# Patient Record
Sex: Female | Born: 1955 | Race: White | Hispanic: No | Marital: Married | Smoking: Never smoker
Health system: Southern US, Community
[De-identification: ages and names within clinical notes are randomized; demographics above are authoritative.]

## PROBLEM LIST (undated history)

## (undated) DIAGNOSIS — R011 Cardiac murmur, unspecified: Secondary | ICD-10-CM

## (undated) DIAGNOSIS — I10 Essential (primary) hypertension: Secondary | ICD-10-CM

## (undated) DIAGNOSIS — I350 Nonrheumatic aortic (valve) stenosis: Secondary | ICD-10-CM

## (undated) HISTORY — DX: Essential (primary) hypertension: I10

## (undated) HISTORY — DX: Nonrheumatic aortic (valve) stenosis: I35.0

## (undated) HISTORY — DX: Cardiac murmur, unspecified: R01.1

## (undated) HISTORY — PX: HYSTERECTOMY, TOTAL, LAPAROSCOPIC, ROBOT-ASSISTED WITH SALPINGECTOMY: SHX7587

## (undated) HISTORY — PX: OTHER SURGICAL HISTORY: SHX169

## (undated) HISTORY — PX: LAPAROSCOPIC SIGMOID COLECTOMY: SHX5928

---

## 1999-05-13 HISTORY — PX: APPENDECTOMY: SHX54

## 1999-11-22 ENCOUNTER — Encounter: Admission: RE | Admit: 1999-11-22 | Discharge: 2000-02-20 | Payer: Self-pay | Admitting: Radiation Oncology

## 2004-03-15 ENCOUNTER — Ambulatory Visit: Payer: Self-pay | Admitting: Oncology

## 2004-09-19 ENCOUNTER — Ambulatory Visit: Payer: Self-pay | Admitting: Oncology

## 2005-01-17 ENCOUNTER — Ambulatory Visit: Payer: Self-pay | Admitting: Oncology

## 2005-05-14 ENCOUNTER — Ambulatory Visit: Payer: Self-pay | Admitting: Oncology

## 2005-06-23 ENCOUNTER — Encounter: Admission: RE | Admit: 2005-06-23 | Discharge: 2005-06-23 | Payer: Self-pay | Admitting: Nephrology

## 2007-02-12 IMAGING — US US RENAL
1 series · 13 of 25 positions shown · non-contrast
Comparison: None.

CLINICAL DATA: Hypertension/renal insufficiency.  History of medullary sponge kidney.  
 RENAL/URINARY TRACT ULTRASOUND:
TECHNIQUE: Complete ultrasound examination of the urinary tract was performed including evaluation of the kidneys, renal collecting systems, and urinary bladder.

[Series 1: unknown · 0.27mm/px · 13 of 39 slices shown]
[im 1/39]
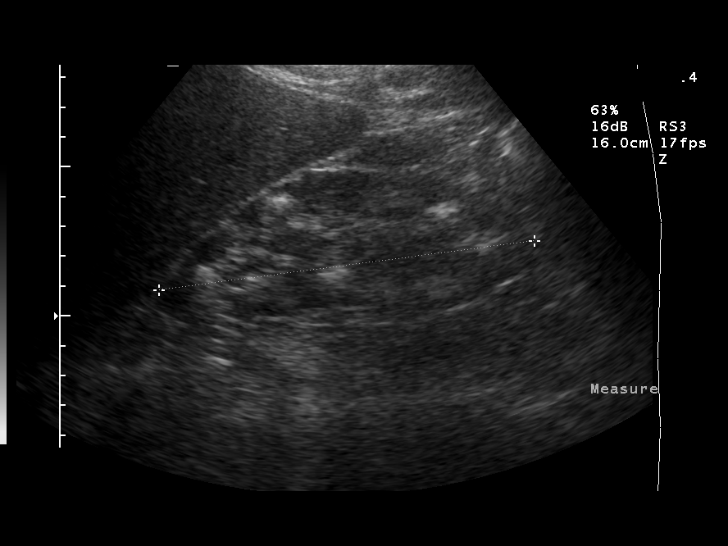
[im 4/39]
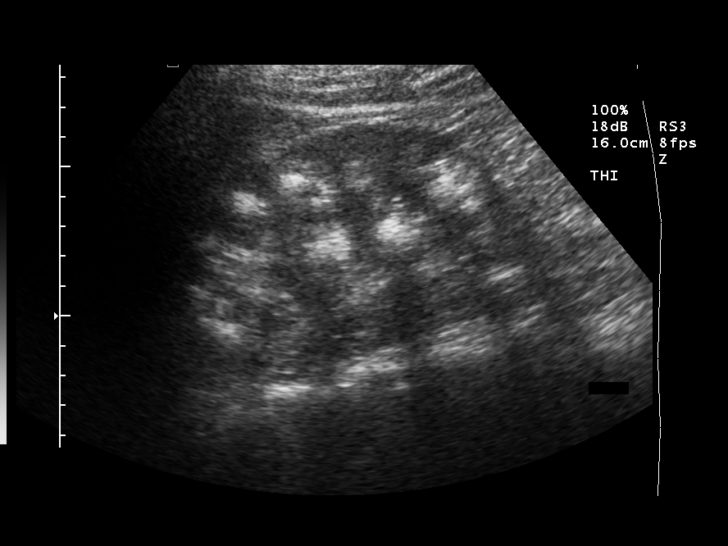
[im 7/39]
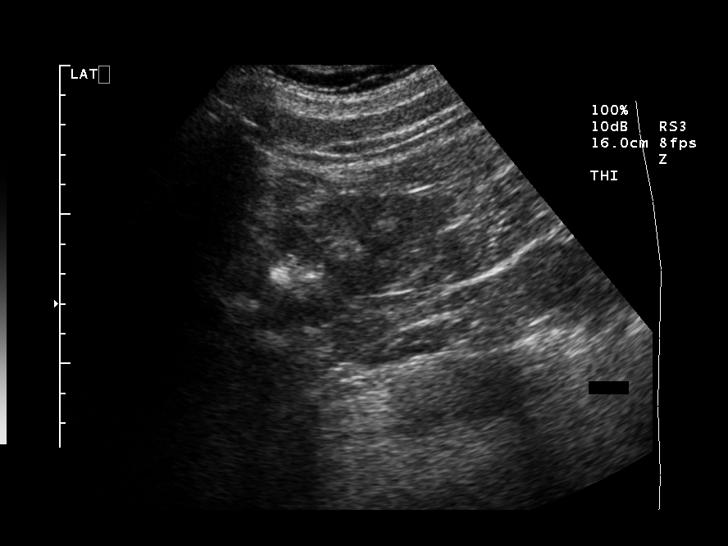
[im 10/39]
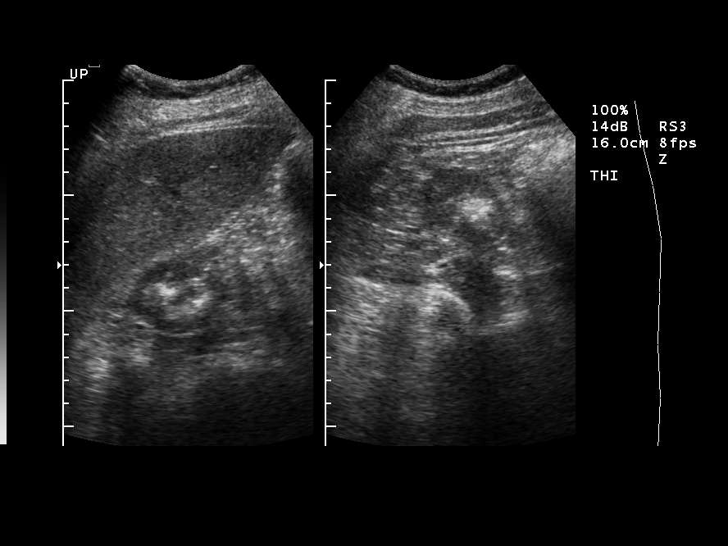
[im 13/39]
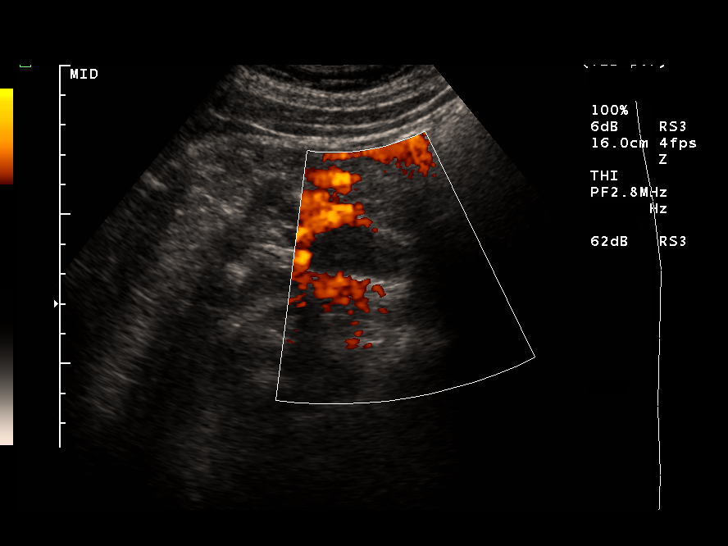
[im 16/39]
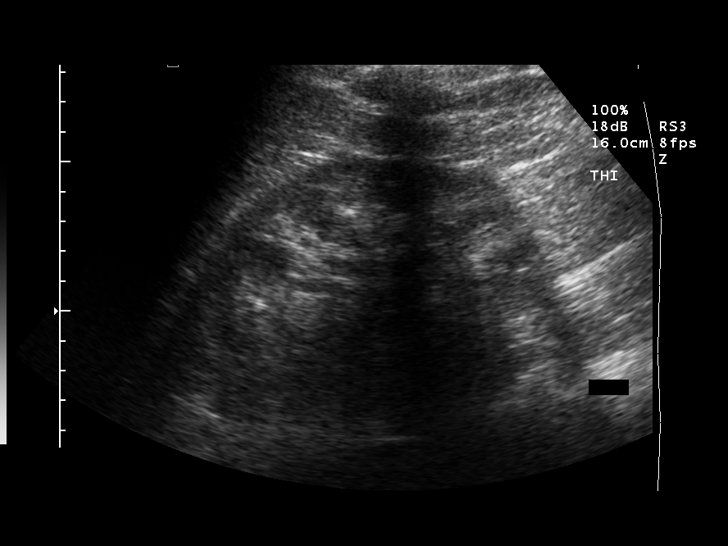
[im 20/39]
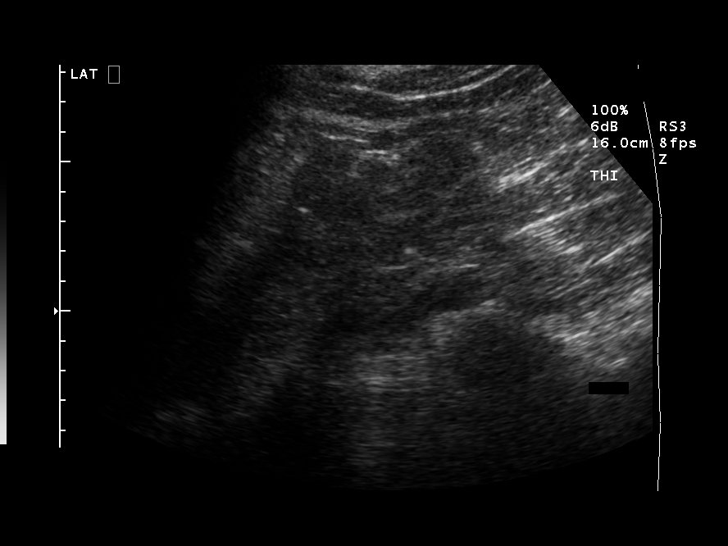
[im 23/39]
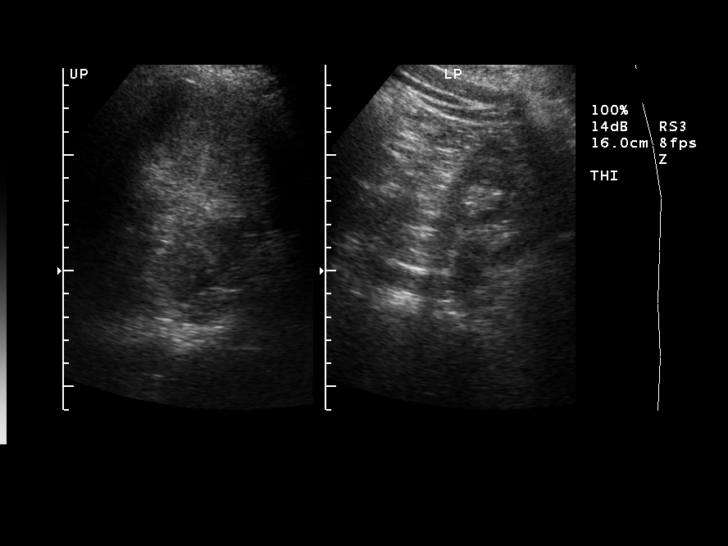
[im 26/39]
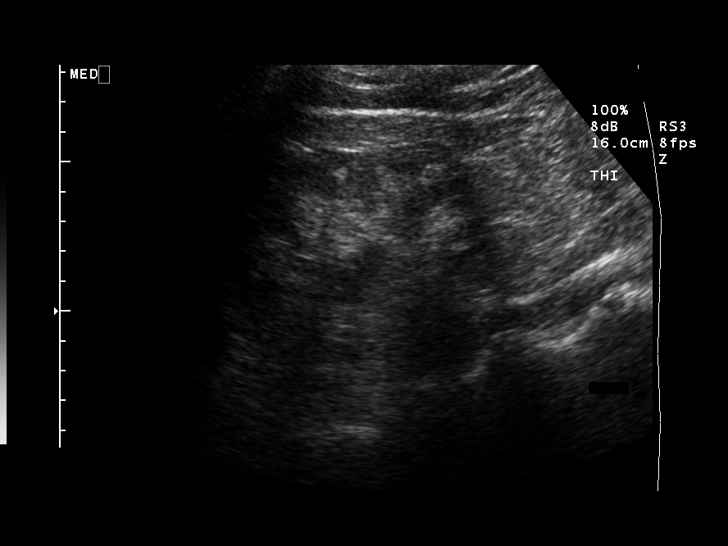
[im 29/39]
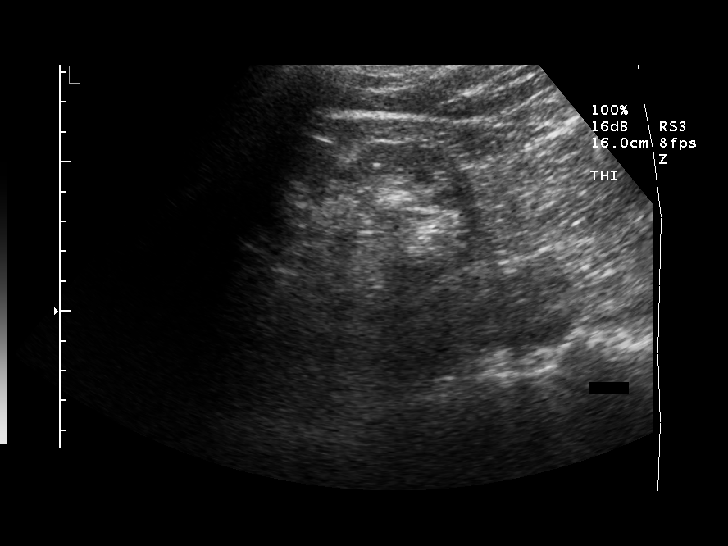
[im 32/39]
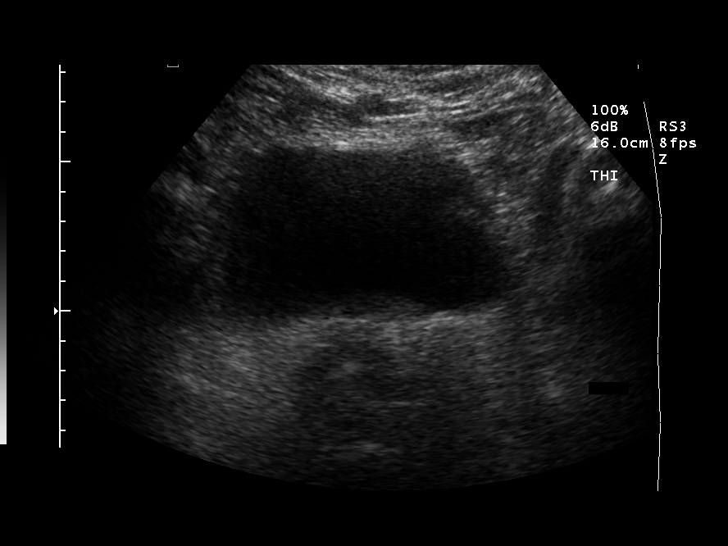
[im 35/39]
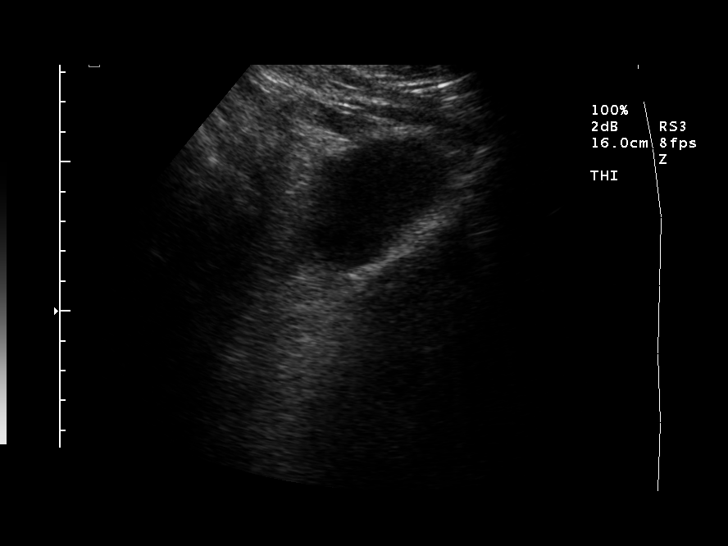
[im 39/39]
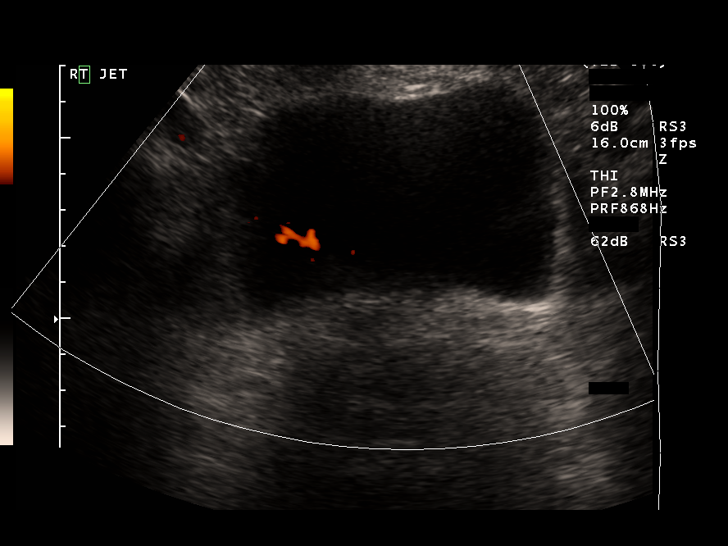

[13 of 25 positions shown; findings below may reference images not displayed]

FINDINGS: The right kidney is 12.7 and the left 13.1 cm in size.  There is striking increased density of the renal medulla/pericaliceal regions bilaterally, more so on the right than the left.  The findings are characteristic of nephrocalcinosis and quite consistent with medullary sponge kidney.  There is some prominence of the renal pelvis and perhaps minimal caliectasis on the right.  No caliectasis or pyelectasis on the left.  Bladder unremarkable.  There are bilateral ureteral jets but the jet on the right is diminished as compared to the jet on the left.  A mild obstructive component cannot be excluded on the right, based on these findings.
IMPRESSION: 1.  Findings consistent with bilateral pericaliceal nephrocalcinosis.  
 2.  There is mild caliectasis and pyelectasis on the right, with a relative decrease in the right ureteral jet.  Question obstructive component.  See report.

## 2013-06-14 ENCOUNTER — Ambulatory Visit (INDEPENDENT_AMBULATORY_CARE_PROVIDER_SITE_OTHER): Payer: BC Managed Care – PPO | Admitting: Neurology

## 2013-06-14 ENCOUNTER — Ambulatory Visit (INDEPENDENT_AMBULATORY_CARE_PROVIDER_SITE_OTHER): Payer: Self-pay | Admitting: Radiology

## 2013-06-14 DIAGNOSIS — M25519 Pain in unspecified shoulder: Secondary | ICD-10-CM

## 2013-06-14 DIAGNOSIS — Z0289 Encounter for other administrative examinations: Secondary | ICD-10-CM

## 2013-06-14 DIAGNOSIS — M79609 Pain in unspecified limb: Secondary | ICD-10-CM

## 2013-06-14 DIAGNOSIS — M25511 Pain in right shoulder: Secondary | ICD-10-CM

## 2013-06-14 NOTE — Procedures (Signed)
   NCS (NERVE CONDUCTION STUDY) WITH EMG (ELECTROMYOGRAPHY) REPORT   STUDY DATE: February third 2015 PATIENT NAME: Mackenzie Pearson DOB: 01-16-1956 MRN: [id number]    TECHNOLOGIST: Kaylyn LimSue Fox ELECTROMYOGRAPHER: Levert FeinsteinYan, Eriberto Felch M.D.  CLINICAL INFORMATION:  58 years old right-handed Caucasian female, with acute onset of right anterior shoulder pain in May 06 2013, radiating pain right lateral arm, ulnar forearm, subjective weakness, she denies significant neck pain,  On examination:there is no atrophy, she has slight right shoulder abduction, external rotation, elbow flexion, pronation weakness, Rest of the muscle group testing was normal, mildly decreased right brachioradialis reflex, well-preserved bilateral biceps, triceps reflex   FINDINGS: NERVE CONDUCTION STUDY: Right median sensory response has mildly increased the peak latency, normal snap amplitude. Right ulnar, radial sensory responses were normal. Right ulnar, radial nerve responses were normal. Right median motor response showed mildly prolonged distal latency, with preserved C. map amplitude, conduction velocity, F wave latency.   Bilateral median, lateral anticutaneous sensory responses were normal   NEEDLE ELECTROMYOGRAPHY: Selected needle examination was performed at right upper extremity muscles, and right cervical paraspinal muscles   Needle examination of right first dorsal interossei, flexor carpi ulnaris, Extensor digital communis, pronator teres, biceps, triceps, deltoid, brachioradialis was essentially normal,  There was no spontaneous activity at right cervical paraspinal muscles, right C5, 6, 7  IMPRESSION:   This is mild abnormal study, there is mild to moderate right carpal tunnel syndrome, there is no evidence of right brachial plexopathy, or right cervical radiculopathy.   INTERPRETING PHYSICIAN:   Levert FeinsteinYan, Kiasha Bellin M.D. Ph.D. [facility] [address], [address] [place], [place] [address] [phone]  [phone]

## 2019-08-11 DIAGNOSIS — N135 Crossing vessel and stricture of ureter without hydronephrosis: Secondary | ICD-10-CM | POA: Diagnosis not present

## 2019-08-11 DIAGNOSIS — N200 Calculus of kidney: Secondary | ICD-10-CM | POA: Diagnosis not present

## 2019-10-19 DIAGNOSIS — I700 Atherosclerosis of aorta: Secondary | ICD-10-CM | POA: Diagnosis not present

## 2019-10-19 DIAGNOSIS — N132 Hydronephrosis with renal and ureteral calculous obstruction: Secondary | ICD-10-CM | POA: Diagnosis not present

## 2019-10-19 DIAGNOSIS — N200 Calculus of kidney: Secondary | ICD-10-CM | POA: Diagnosis not present

## 2019-10-19 DIAGNOSIS — Z79899 Other long term (current) drug therapy: Secondary | ICD-10-CM | POA: Diagnosis not present

## 2019-10-19 DIAGNOSIS — R109 Unspecified abdominal pain: Secondary | ICD-10-CM | POA: Diagnosis not present

## 2019-10-19 DIAGNOSIS — K449 Diaphragmatic hernia without obstruction or gangrene: Secondary | ICD-10-CM | POA: Diagnosis not present

## 2019-10-19 DIAGNOSIS — I10 Essential (primary) hypertension: Secondary | ICD-10-CM | POA: Diagnosis not present

## 2019-10-20 DIAGNOSIS — N289 Disorder of kidney and ureter, unspecified: Secondary | ICD-10-CM | POA: Diagnosis not present

## 2019-10-20 DIAGNOSIS — N1330 Unspecified hydronephrosis: Secondary | ICD-10-CM | POA: Diagnosis not present

## 2019-10-20 DIAGNOSIS — N200 Calculus of kidney: Secondary | ICD-10-CM | POA: Diagnosis not present

## 2019-10-21 DIAGNOSIS — Z79891 Long term (current) use of opiate analgesic: Secondary | ICD-10-CM | POA: Diagnosis not present

## 2019-10-21 DIAGNOSIS — F988 Other specified behavioral and emotional disorders with onset usually occurring in childhood and adolescence: Secondary | ICD-10-CM | POA: Diagnosis not present

## 2019-10-21 DIAGNOSIS — N201 Calculus of ureter: Secondary | ICD-10-CM | POA: Diagnosis not present

## 2019-10-21 DIAGNOSIS — F418 Other specified anxiety disorders: Secondary | ICD-10-CM | POA: Diagnosis not present

## 2019-10-21 DIAGNOSIS — J302 Other seasonal allergic rhinitis: Secondary | ICD-10-CM | POA: Diagnosis not present

## 2019-10-21 DIAGNOSIS — Z79899 Other long term (current) drug therapy: Secondary | ICD-10-CM | POA: Diagnosis not present

## 2019-10-21 DIAGNOSIS — Z85038 Personal history of other malignant neoplasm of large intestine: Secondary | ICD-10-CM | POA: Diagnosis not present

## 2019-10-21 DIAGNOSIS — K219 Gastro-esophageal reflux disease without esophagitis: Secondary | ICD-10-CM | POA: Diagnosis not present

## 2019-10-21 DIAGNOSIS — G8929 Other chronic pain: Secondary | ICD-10-CM | POA: Diagnosis not present

## 2019-10-21 DIAGNOSIS — N132 Hydronephrosis with renal and ureteral calculous obstruction: Secondary | ICD-10-CM | POA: Diagnosis not present

## 2019-10-21 DIAGNOSIS — N289 Disorder of kidney and ureter, unspecified: Secondary | ICD-10-CM | POA: Diagnosis not present

## 2019-10-21 DIAGNOSIS — N1330 Unspecified hydronephrosis: Secondary | ICD-10-CM | POA: Diagnosis not present

## 2019-10-21 DIAGNOSIS — E669 Obesity, unspecified: Secondary | ICD-10-CM | POA: Diagnosis not present

## 2019-10-21 DIAGNOSIS — I10 Essential (primary) hypertension: Secondary | ICD-10-CM | POA: Diagnosis not present

## 2019-12-21 DIAGNOSIS — N390 Urinary tract infection, site not specified: Secondary | ICD-10-CM | POA: Diagnosis not present

## 2019-12-21 DIAGNOSIS — N200 Calculus of kidney: Secondary | ICD-10-CM | POA: Diagnosis not present

## 2019-12-21 DIAGNOSIS — N289 Disorder of kidney and ureter, unspecified: Secondary | ICD-10-CM | POA: Diagnosis not present

## 2019-12-21 DIAGNOSIS — N1330 Unspecified hydronephrosis: Secondary | ICD-10-CM | POA: Diagnosis not present

## 2019-12-21 DIAGNOSIS — Z87442 Personal history of urinary calculi: Secondary | ICD-10-CM | POA: Diagnosis not present

## 2020-01-23 DIAGNOSIS — Z1159 Encounter for screening for other viral diseases: Secondary | ICD-10-CM | POA: Diagnosis not present

## 2020-01-26 DIAGNOSIS — Z466 Encounter for fitting and adjustment of urinary device: Secondary | ICD-10-CM | POA: Diagnosis not present

## 2020-01-26 DIAGNOSIS — N131 Hydronephrosis with ureteral stricture, not elsewhere classified: Secondary | ICD-10-CM | POA: Diagnosis not present

## 2020-01-26 DIAGNOSIS — N130 Hydronephrosis with ureteropelvic junction obstruction: Secondary | ICD-10-CM | POA: Diagnosis not present

## 2020-01-26 DIAGNOSIS — K219 Gastro-esophageal reflux disease without esophagitis: Secondary | ICD-10-CM | POA: Diagnosis not present

## 2020-01-26 DIAGNOSIS — N132 Hydronephrosis with renal and ureteral calculous obstruction: Secondary | ICD-10-CM | POA: Diagnosis not present

## 2020-01-26 DIAGNOSIS — I10 Essential (primary) hypertension: Secondary | ICD-10-CM | POA: Diagnosis not present

## 2020-01-26 DIAGNOSIS — N1330 Unspecified hydronephrosis: Secondary | ICD-10-CM | POA: Diagnosis not present

## 2020-02-21 DIAGNOSIS — N289 Disorder of kidney and ureter, unspecified: Secondary | ICD-10-CM | POA: Diagnosis not present

## 2020-02-21 DIAGNOSIS — N1330 Unspecified hydronephrosis: Secondary | ICD-10-CM | POA: Diagnosis not present

## 2020-02-21 DIAGNOSIS — N200 Calculus of kidney: Secondary | ICD-10-CM | POA: Diagnosis not present

## 2020-04-04 DIAGNOSIS — N1330 Unspecified hydronephrosis: Secondary | ICD-10-CM | POA: Diagnosis not present

## 2020-04-04 DIAGNOSIS — N289 Disorder of kidney and ureter, unspecified: Secondary | ICD-10-CM | POA: Diagnosis not present

## 2020-04-26 DIAGNOSIS — Z79891 Long term (current) use of opiate analgesic: Secondary | ICD-10-CM | POA: Diagnosis not present

## 2020-04-26 DIAGNOSIS — K219 Gastro-esophageal reflux disease without esophagitis: Secondary | ICD-10-CM | POA: Diagnosis not present

## 2020-04-26 DIAGNOSIS — M1712 Unilateral primary osteoarthritis, left knee: Secondary | ICD-10-CM | POA: Diagnosis not present

## 2020-04-26 DIAGNOSIS — M5412 Radiculopathy, cervical region: Secondary | ICD-10-CM | POA: Diagnosis not present

## 2020-04-26 DIAGNOSIS — I10 Essential (primary) hypertension: Secondary | ICD-10-CM | POA: Diagnosis not present

## 2020-04-26 DIAGNOSIS — N130 Hydronephrosis with ureteropelvic junction obstruction: Secondary | ICD-10-CM | POA: Diagnosis not present

## 2020-04-26 DIAGNOSIS — G8929 Other chronic pain: Secondary | ICD-10-CM | POA: Diagnosis not present

## 2020-04-26 DIAGNOSIS — Z466 Encounter for fitting and adjustment of urinary device: Secondary | ICD-10-CM | POA: Diagnosis not present

## 2020-04-26 DIAGNOSIS — Z79899 Other long term (current) drug therapy: Secondary | ICD-10-CM | POA: Diagnosis not present

## 2020-04-26 DIAGNOSIS — N132 Hydronephrosis with renal and ureteral calculous obstruction: Secondary | ICD-10-CM | POA: Diagnosis not present

## 2020-04-26 DIAGNOSIS — E669 Obesity, unspecified: Secondary | ICD-10-CM | POA: Diagnosis not present

## 2020-04-26 DIAGNOSIS — Z87442 Personal history of urinary calculi: Secondary | ICD-10-CM | POA: Diagnosis not present

## 2020-04-26 DIAGNOSIS — N135 Crossing vessel and stricture of ureter without hydronephrosis: Secondary | ICD-10-CM | POA: Diagnosis not present

## 2020-04-26 DIAGNOSIS — N189 Chronic kidney disease, unspecified: Secondary | ICD-10-CM | POA: Diagnosis not present

## 2020-04-26 DIAGNOSIS — I129 Hypertensive chronic kidney disease with stage 1 through stage 4 chronic kidney disease, or unspecified chronic kidney disease: Secondary | ICD-10-CM | POA: Diagnosis not present

## 2020-05-23 DIAGNOSIS — E2839 Other primary ovarian failure: Secondary | ICD-10-CM | POA: Diagnosis not present

## 2020-05-23 DIAGNOSIS — Z1331 Encounter for screening for depression: Secondary | ICD-10-CM | POA: Diagnosis not present

## 2020-05-23 DIAGNOSIS — Z1159 Encounter for screening for other viral diseases: Secondary | ICD-10-CM | POA: Diagnosis not present

## 2020-05-23 DIAGNOSIS — Z1231 Encounter for screening mammogram for malignant neoplasm of breast: Secondary | ICD-10-CM | POA: Diagnosis not present

## 2020-05-23 DIAGNOSIS — Z131 Encounter for screening for diabetes mellitus: Secondary | ICD-10-CM | POA: Diagnosis not present

## 2020-05-23 DIAGNOSIS — R0981 Nasal congestion: Secondary | ICD-10-CM | POA: Diagnosis not present

## 2020-05-23 DIAGNOSIS — Z0001 Encounter for general adult medical examination with abnormal findings: Secondary | ICD-10-CM | POA: Diagnosis not present

## 2020-05-23 DIAGNOSIS — F419 Anxiety disorder, unspecified: Secondary | ICD-10-CM | POA: Diagnosis not present

## 2020-05-23 DIAGNOSIS — F332 Major depressive disorder, recurrent severe without psychotic features: Secondary | ICD-10-CM | POA: Diagnosis not present

## 2020-05-23 DIAGNOSIS — R41840 Attention and concentration deficit: Secondary | ICD-10-CM | POA: Diagnosis not present

## 2020-08-02 DIAGNOSIS — R051 Acute cough: Secondary | ICD-10-CM | POA: Diagnosis not present

## 2020-08-17 DIAGNOSIS — N1330 Unspecified hydronephrosis: Secondary | ICD-10-CM | POA: Diagnosis not present

## 2020-08-17 DIAGNOSIS — N289 Disorder of kidney and ureter, unspecified: Secondary | ICD-10-CM | POA: Diagnosis not present

## 2020-08-17 DIAGNOSIS — N390 Urinary tract infection, site not specified: Secondary | ICD-10-CM | POA: Diagnosis not present

## 2020-08-25 DIAGNOSIS — R509 Fever, unspecified: Secondary | ICD-10-CM | POA: Diagnosis not present

## 2020-08-25 DIAGNOSIS — J189 Pneumonia, unspecified organism: Secondary | ICD-10-CM | POA: Diagnosis not present

## 2020-08-25 DIAGNOSIS — R0981 Nasal congestion: Secondary | ICD-10-CM | POA: Diagnosis not present

## 2020-08-25 DIAGNOSIS — J111 Influenza due to unidentified influenza virus with other respiratory manifestations: Secondary | ICD-10-CM | POA: Diagnosis not present

## 2020-08-25 DIAGNOSIS — Z20828 Contact with and (suspected) exposure to other viral communicable diseases: Secondary | ICD-10-CM | POA: Diagnosis not present

## 2020-08-25 DIAGNOSIS — R0602 Shortness of breath: Secondary | ICD-10-CM | POA: Diagnosis not present

## 2020-08-25 DIAGNOSIS — R051 Acute cough: Secondary | ICD-10-CM | POA: Diagnosis not present

## 2020-08-28 DIAGNOSIS — N200 Calculus of kidney: Secondary | ICD-10-CM | POA: Diagnosis not present

## 2020-08-28 DIAGNOSIS — I878 Other specified disorders of veins: Secondary | ICD-10-CM | POA: Diagnosis not present

## 2020-08-28 DIAGNOSIS — N183 Chronic kidney disease, stage 3 unspecified: Secondary | ICD-10-CM | POA: Diagnosis not present

## 2020-08-28 DIAGNOSIS — N289 Disorder of kidney and ureter, unspecified: Secondary | ICD-10-CM | POA: Diagnosis not present

## 2020-08-28 DIAGNOSIS — Z960 Presence of urogenital implants: Secondary | ICD-10-CM | POA: Diagnosis not present

## 2020-08-28 DIAGNOSIS — N1330 Unspecified hydronephrosis: Secondary | ICD-10-CM | POA: Diagnosis not present

## 2020-08-30 DIAGNOSIS — N131 Hydronephrosis with ureteral stricture, not elsewhere classified: Secondary | ICD-10-CM | POA: Diagnosis not present

## 2020-08-30 DIAGNOSIS — Z87442 Personal history of urinary calculi: Secondary | ICD-10-CM | POA: Diagnosis not present

## 2020-08-30 DIAGNOSIS — Z466 Encounter for fitting and adjustment of urinary device: Secondary | ICD-10-CM | POA: Diagnosis not present

## 2020-08-30 DIAGNOSIS — F419 Anxiety disorder, unspecified: Secondary | ICD-10-CM | POA: Diagnosis not present

## 2020-08-30 DIAGNOSIS — E669 Obesity, unspecified: Secondary | ICD-10-CM | POA: Diagnosis not present

## 2020-08-30 DIAGNOSIS — Z85038 Personal history of other malignant neoplasm of large intestine: Secondary | ICD-10-CM | POA: Diagnosis not present

## 2020-08-30 DIAGNOSIS — N135 Crossing vessel and stricture of ureter without hydronephrosis: Secondary | ICD-10-CM | POA: Diagnosis not present

## 2020-08-30 DIAGNOSIS — F329 Major depressive disorder, single episode, unspecified: Secondary | ICD-10-CM | POA: Diagnosis not present

## 2020-08-30 DIAGNOSIS — I10 Essential (primary) hypertension: Secondary | ICD-10-CM | POA: Diagnosis not present

## 2020-08-30 DIAGNOSIS — Q615 Medullary cystic kidney: Secondary | ICD-10-CM | POA: Diagnosis not present

## 2020-08-30 DIAGNOSIS — N1330 Unspecified hydronephrosis: Secondary | ICD-10-CM | POA: Diagnosis not present

## 2020-10-09 DIAGNOSIS — N390 Urinary tract infection, site not specified: Secondary | ICD-10-CM | POA: Diagnosis not present

## 2020-10-16 DIAGNOSIS — D2239 Melanocytic nevi of other parts of face: Secondary | ICD-10-CM | POA: Diagnosis not present

## 2020-10-16 DIAGNOSIS — L821 Other seborrheic keratosis: Secondary | ICD-10-CM | POA: Diagnosis not present

## 2020-10-16 DIAGNOSIS — L820 Inflamed seborrheic keratosis: Secondary | ICD-10-CM | POA: Diagnosis not present

## 2020-10-16 DIAGNOSIS — D485 Neoplasm of uncertain behavior of skin: Secondary | ICD-10-CM | POA: Diagnosis not present

## 2020-10-16 DIAGNOSIS — L814 Other melanin hyperpigmentation: Secondary | ICD-10-CM | POA: Diagnosis not present

## 2020-11-26 DIAGNOSIS — N390 Urinary tract infection, site not specified: Secondary | ICD-10-CM | POA: Diagnosis not present

## 2020-11-27 DIAGNOSIS — F3341 Major depressive disorder, recurrent, in partial remission: Secondary | ICD-10-CM | POA: Diagnosis not present

## 2020-11-27 DIAGNOSIS — Z79899 Other long term (current) drug therapy: Secondary | ICD-10-CM | POA: Diagnosis not present

## 2020-11-27 DIAGNOSIS — F419 Anxiety disorder, unspecified: Secondary | ICD-10-CM | POA: Diagnosis not present

## 2020-11-27 DIAGNOSIS — R7303 Prediabetes: Secondary | ICD-10-CM | POA: Diagnosis not present

## 2020-11-27 DIAGNOSIS — I10 Essential (primary) hypertension: Secondary | ICD-10-CM | POA: Diagnosis not present

## 2020-11-29 DIAGNOSIS — K219 Gastro-esophageal reflux disease without esophagitis: Secondary | ICD-10-CM | POA: Diagnosis not present

## 2020-11-29 DIAGNOSIS — F419 Anxiety disorder, unspecified: Secondary | ICD-10-CM | POA: Diagnosis not present

## 2020-11-29 DIAGNOSIS — N131 Hydronephrosis with ureteral stricture, not elsewhere classified: Secondary | ICD-10-CM | POA: Diagnosis not present

## 2020-11-29 DIAGNOSIS — N135 Crossing vessel and stricture of ureter without hydronephrosis: Secondary | ICD-10-CM | POA: Diagnosis not present

## 2020-11-29 DIAGNOSIS — I10 Essential (primary) hypertension: Secondary | ICD-10-CM | POA: Diagnosis not present

## 2020-11-29 DIAGNOSIS — F329 Major depressive disorder, single episode, unspecified: Secondary | ICD-10-CM | POA: Diagnosis not present

## 2020-11-29 DIAGNOSIS — Z85038 Personal history of other malignant neoplasm of large intestine: Secondary | ICD-10-CM | POA: Diagnosis not present

## 2020-11-29 DIAGNOSIS — N1330 Unspecified hydronephrosis: Secondary | ICD-10-CM | POA: Diagnosis not present

## 2020-11-29 DIAGNOSIS — Z87442 Personal history of urinary calculi: Secondary | ICD-10-CM | POA: Diagnosis not present

## 2020-11-29 DIAGNOSIS — Z466 Encounter for fitting and adjustment of urinary device: Secondary | ICD-10-CM | POA: Diagnosis not present

## 2021-02-20 DIAGNOSIS — R928 Other abnormal and inconclusive findings on diagnostic imaging of breast: Secondary | ICD-10-CM | POA: Diagnosis not present

## 2021-02-20 DIAGNOSIS — N6311 Unspecified lump in the right breast, upper outer quadrant: Secondary | ICD-10-CM | POA: Diagnosis not present

## 2021-02-20 DIAGNOSIS — N6489 Other specified disorders of breast: Secondary | ICD-10-CM | POA: Diagnosis not present

## 2021-03-26 DIAGNOSIS — Z466 Encounter for fitting and adjustment of urinary device: Secondary | ICD-10-CM | POA: Diagnosis not present

## 2021-03-26 DIAGNOSIS — M160 Bilateral primary osteoarthritis of hip: Secondary | ICD-10-CM | POA: Diagnosis not present

## 2021-03-26 DIAGNOSIS — N1330 Unspecified hydronephrosis: Secondary | ICD-10-CM | POA: Diagnosis not present

## 2021-03-26 DIAGNOSIS — I878 Other specified disorders of veins: Secondary | ICD-10-CM | POA: Diagnosis not present

## 2021-03-26 DIAGNOSIS — Z87442 Personal history of urinary calculi: Secondary | ICD-10-CM | POA: Diagnosis not present

## 2021-03-26 DIAGNOSIS — N390 Urinary tract infection, site not specified: Secondary | ICD-10-CM | POA: Diagnosis not present

## 2021-03-28 DIAGNOSIS — N181 Chronic kidney disease, stage 1: Secondary | ICD-10-CM | POA: Diagnosis not present

## 2021-03-28 DIAGNOSIS — N289 Disorder of kidney and ureter, unspecified: Secondary | ICD-10-CM | POA: Diagnosis not present

## 2021-03-28 DIAGNOSIS — Z466 Encounter for fitting and adjustment of urinary device: Secondary | ICD-10-CM | POA: Diagnosis not present

## 2021-03-28 DIAGNOSIS — N132 Hydronephrosis with renal and ureteral calculous obstruction: Secondary | ICD-10-CM | POA: Diagnosis not present

## 2021-03-28 DIAGNOSIS — N131 Hydronephrosis with ureteral stricture, not elsewhere classified: Secondary | ICD-10-CM | POA: Diagnosis not present

## 2021-03-28 DIAGNOSIS — I10 Essential (primary) hypertension: Secondary | ICD-10-CM | POA: Diagnosis not present

## 2021-03-28 DIAGNOSIS — Z85038 Personal history of other malignant neoplasm of large intestine: Secondary | ICD-10-CM | POA: Diagnosis not present

## 2021-05-14 DIAGNOSIS — N3090 Cystitis, unspecified without hematuria: Secondary | ICD-10-CM | POA: Diagnosis not present

## 2021-05-14 DIAGNOSIS — M545 Low back pain, unspecified: Secondary | ICD-10-CM | POA: Diagnosis not present

## 2021-05-17 DIAGNOSIS — R109 Unspecified abdominal pain: Secondary | ICD-10-CM | POA: Diagnosis not present

## 2021-05-17 DIAGNOSIS — N1330 Unspecified hydronephrosis: Secondary | ICD-10-CM | POA: Diagnosis not present

## 2021-05-17 DIAGNOSIS — N390 Urinary tract infection, site not specified: Secondary | ICD-10-CM | POA: Diagnosis not present

## 2021-06-03 DIAGNOSIS — I10 Essential (primary) hypertension: Secondary | ICD-10-CM | POA: Diagnosis not present

## 2021-06-03 DIAGNOSIS — Z79899 Other long term (current) drug therapy: Secondary | ICD-10-CM | POA: Diagnosis not present

## 2021-06-03 DIAGNOSIS — F419 Anxiety disorder, unspecified: Secondary | ICD-10-CM | POA: Diagnosis not present

## 2021-06-03 DIAGNOSIS — R7303 Prediabetes: Secondary | ICD-10-CM | POA: Diagnosis not present

## 2021-06-03 DIAGNOSIS — J029 Acute pharyngitis, unspecified: Secondary | ICD-10-CM | POA: Diagnosis not present

## 2021-06-13 DIAGNOSIS — Z85038 Personal history of other malignant neoplasm of large intestine: Secondary | ICD-10-CM | POA: Diagnosis not present

## 2021-06-13 DIAGNOSIS — N289 Disorder of kidney and ureter, unspecified: Secondary | ICD-10-CM | POA: Diagnosis not present

## 2021-06-13 DIAGNOSIS — N131 Hydronephrosis with ureteral stricture, not elsewhere classified: Secondary | ICD-10-CM | POA: Diagnosis not present

## 2021-06-13 DIAGNOSIS — Z9049 Acquired absence of other specified parts of digestive tract: Secondary | ICD-10-CM | POA: Diagnosis not present

## 2021-06-13 DIAGNOSIS — Z466 Encounter for fitting and adjustment of urinary device: Secondary | ICD-10-CM | POA: Diagnosis not present

## 2021-06-13 DIAGNOSIS — Z87442 Personal history of urinary calculi: Secondary | ICD-10-CM | POA: Diagnosis not present

## 2021-06-13 DIAGNOSIS — N132 Hydronephrosis with renal and ureteral calculous obstruction: Secondary | ICD-10-CM | POA: Diagnosis not present

## 2021-07-01 DIAGNOSIS — M9901 Segmental and somatic dysfunction of cervical region: Secondary | ICD-10-CM | POA: Diagnosis not present

## 2021-07-01 DIAGNOSIS — M5413 Radiculopathy, cervicothoracic region: Secondary | ICD-10-CM | POA: Diagnosis not present

## 2021-07-01 DIAGNOSIS — G589 Mononeuropathy, unspecified: Secondary | ICD-10-CM | POA: Diagnosis not present

## 2021-07-01 DIAGNOSIS — M47812 Spondylosis without myelopathy or radiculopathy, cervical region: Secondary | ICD-10-CM | POA: Diagnosis not present

## 2021-08-29 DIAGNOSIS — G4700 Insomnia, unspecified: Secondary | ICD-10-CM | POA: Diagnosis not present

## 2021-08-29 DIAGNOSIS — R0989 Other specified symptoms and signs involving the circulatory and respiratory systems: Secondary | ICD-10-CM | POA: Diagnosis not present

## 2021-08-29 DIAGNOSIS — R202 Paresthesia of skin: Secondary | ICD-10-CM | POA: Diagnosis not present

## 2021-08-29 DIAGNOSIS — M4802 Spinal stenosis, cervical region: Secondary | ICD-10-CM | POA: Diagnosis not present

## 2021-09-26 DIAGNOSIS — M5412 Radiculopathy, cervical region: Secondary | ICD-10-CM | POA: Diagnosis not present

## 2021-10-01 DIAGNOSIS — M5412 Radiculopathy, cervical region: Secondary | ICD-10-CM | POA: Diagnosis not present

## 2021-10-01 DIAGNOSIS — R202 Paresthesia of skin: Secondary | ICD-10-CM | POA: Diagnosis not present

## 2021-10-03 DIAGNOSIS — I10 Essential (primary) hypertension: Secondary | ICD-10-CM | POA: Diagnosis not present

## 2021-10-03 DIAGNOSIS — R12 Heartburn: Secondary | ICD-10-CM | POA: Diagnosis not present

## 2021-10-03 DIAGNOSIS — R0989 Other specified symptoms and signs involving the circulatory and respiratory systems: Secondary | ICD-10-CM | POA: Diagnosis not present

## 2021-10-03 DIAGNOSIS — R059 Cough, unspecified: Secondary | ICD-10-CM | POA: Diagnosis not present

## 2021-10-04 DIAGNOSIS — M5412 Radiculopathy, cervical region: Secondary | ICD-10-CM | POA: Diagnosis not present

## 2021-10-09 DIAGNOSIS — N1330 Unspecified hydronephrosis: Secondary | ICD-10-CM | POA: Diagnosis not present

## 2021-10-09 DIAGNOSIS — N390 Urinary tract infection, site not specified: Secondary | ICD-10-CM | POA: Diagnosis not present

## 2021-10-11 DIAGNOSIS — M5412 Radiculopathy, cervical region: Secondary | ICD-10-CM | POA: Diagnosis not present

## 2021-10-11 DIAGNOSIS — M50121 Cervical disc disorder at C4-C5 level with radiculopathy: Secondary | ICD-10-CM | POA: Diagnosis not present

## 2021-10-11 DIAGNOSIS — M50122 Cervical disc disorder at C5-C6 level with radiculopathy: Secondary | ICD-10-CM | POA: Diagnosis not present

## 2021-10-15 DIAGNOSIS — Z87442 Personal history of urinary calculi: Secondary | ICD-10-CM | POA: Diagnosis not present

## 2021-10-15 DIAGNOSIS — N131 Hydronephrosis with ureteral stricture, not elsewhere classified: Secondary | ICD-10-CM | POA: Diagnosis not present

## 2021-10-15 DIAGNOSIS — Z9221 Personal history of antineoplastic chemotherapy: Secondary | ICD-10-CM | POA: Diagnosis not present

## 2021-10-15 DIAGNOSIS — Z85038 Personal history of other malignant neoplasm of large intestine: Secondary | ICD-10-CM | POA: Diagnosis not present

## 2021-10-15 DIAGNOSIS — N135 Crossing vessel and stricture of ureter without hydronephrosis: Secondary | ICD-10-CM | POA: Diagnosis not present

## 2021-10-15 DIAGNOSIS — Z466 Encounter for fitting and adjustment of urinary device: Secondary | ICD-10-CM | POA: Diagnosis not present

## 2021-10-15 DIAGNOSIS — Z923 Personal history of irradiation: Secondary | ICD-10-CM | POA: Diagnosis not present

## 2021-12-11 DIAGNOSIS — N390 Urinary tract infection, site not specified: Secondary | ICD-10-CM | POA: Diagnosis not present

## 2021-12-23 DIAGNOSIS — J209 Acute bronchitis, unspecified: Secondary | ICD-10-CM | POA: Diagnosis not present

## 2021-12-23 DIAGNOSIS — R051 Acute cough: Secondary | ICD-10-CM | POA: Diagnosis not present

## 2022-01-07 DIAGNOSIS — N1330 Unspecified hydronephrosis: Secondary | ICD-10-CM | POA: Diagnosis not present

## 2022-01-07 DIAGNOSIS — N390 Urinary tract infection, site not specified: Secondary | ICD-10-CM | POA: Diagnosis not present

## 2022-01-16 DIAGNOSIS — Z85038 Personal history of other malignant neoplasm of large intestine: Secondary | ICD-10-CM | POA: Diagnosis not present

## 2022-01-16 DIAGNOSIS — N132 Hydronephrosis with renal and ureteral calculous obstruction: Secondary | ICD-10-CM | POA: Diagnosis not present

## 2022-01-16 DIAGNOSIS — N1330 Unspecified hydronephrosis: Secondary | ICD-10-CM | POA: Diagnosis not present

## 2022-01-16 DIAGNOSIS — E669 Obesity, unspecified: Secondary | ICD-10-CM | POA: Diagnosis not present

## 2022-01-16 DIAGNOSIS — Z87442 Personal history of urinary calculi: Secondary | ICD-10-CM | POA: Diagnosis not present

## 2022-01-16 DIAGNOSIS — Z79899 Other long term (current) drug therapy: Secondary | ICD-10-CM | POA: Diagnosis not present

## 2022-01-16 DIAGNOSIS — Q615 Medullary cystic kidney: Secondary | ICD-10-CM | POA: Diagnosis not present

## 2022-01-16 DIAGNOSIS — N135 Crossing vessel and stricture of ureter without hydronephrosis: Secondary | ICD-10-CM | POA: Diagnosis not present

## 2022-01-16 DIAGNOSIS — N131 Hydronephrosis with ureteral stricture, not elsewhere classified: Secondary | ICD-10-CM | POA: Diagnosis not present

## 2022-01-30 DIAGNOSIS — M5412 Radiculopathy, cervical region: Secondary | ICD-10-CM | POA: Diagnosis not present

## 2022-02-05 DIAGNOSIS — Z01818 Encounter for other preprocedural examination: Secondary | ICD-10-CM | POA: Diagnosis not present

## 2022-02-09 HISTORY — PX: CERVICAL SPINE SURGERY: SHX589

## 2022-02-12 DIAGNOSIS — L578 Other skin changes due to chronic exposure to nonionizing radiation: Secondary | ICD-10-CM | POA: Diagnosis not present

## 2022-02-12 DIAGNOSIS — R233 Spontaneous ecchymoses: Secondary | ICD-10-CM | POA: Diagnosis not present

## 2022-02-12 DIAGNOSIS — L820 Inflamed seborrheic keratosis: Secondary | ICD-10-CM | POA: Diagnosis not present

## 2022-02-12 DIAGNOSIS — L814 Other melanin hyperpigmentation: Secondary | ICD-10-CM | POA: Diagnosis not present

## 2022-02-12 DIAGNOSIS — D225 Melanocytic nevi of trunk: Secondary | ICD-10-CM | POA: Diagnosis not present

## 2022-02-24 DIAGNOSIS — R2689 Other abnormalities of gait and mobility: Secondary | ICD-10-CM | POA: Diagnosis not present

## 2022-02-24 DIAGNOSIS — Q615 Medullary cystic kidney: Secondary | ICD-10-CM | POA: Diagnosis not present

## 2022-02-24 DIAGNOSIS — M50121 Cervical disc disorder at C4-C5 level with radiculopathy: Secondary | ICD-10-CM | POA: Diagnosis not present

## 2022-02-24 DIAGNOSIS — M4802 Spinal stenosis, cervical region: Secondary | ICD-10-CM | POA: Diagnosis not present

## 2022-02-24 DIAGNOSIS — M5412 Radiculopathy, cervical region: Secondary | ICD-10-CM | POA: Diagnosis not present

## 2022-02-24 DIAGNOSIS — M50123 Cervical disc disorder at C6-C7 level with radiculopathy: Secondary | ICD-10-CM | POA: Diagnosis not present

## 2022-02-24 DIAGNOSIS — E669 Obesity, unspecified: Secondary | ICD-10-CM | POA: Diagnosis not present

## 2022-02-24 DIAGNOSIS — Z6829 Body mass index (BMI) 29.0-29.9, adult: Secondary | ICD-10-CM | POA: Diagnosis not present

## 2022-02-24 DIAGNOSIS — M50122 Cervical disc disorder at C5-C6 level with radiculopathy: Secondary | ICD-10-CM | POA: Diagnosis not present

## 2022-02-24 DIAGNOSIS — Z23 Encounter for immunization: Secondary | ICD-10-CM | POA: Diagnosis not present

## 2022-02-25 DIAGNOSIS — Q615 Medullary cystic kidney: Secondary | ICD-10-CM | POA: Diagnosis not present

## 2022-02-25 DIAGNOSIS — E669 Obesity, unspecified: Secondary | ICD-10-CM | POA: Diagnosis not present

## 2022-02-25 DIAGNOSIS — M4802 Spinal stenosis, cervical region: Secondary | ICD-10-CM | POA: Diagnosis not present

## 2022-02-25 DIAGNOSIS — Z6829 Body mass index (BMI) 29.0-29.9, adult: Secondary | ICD-10-CM | POA: Diagnosis not present

## 2022-02-25 DIAGNOSIS — M50121 Cervical disc disorder at C4-C5 level with radiculopathy: Secondary | ICD-10-CM | POA: Diagnosis not present

## 2022-02-25 DIAGNOSIS — R2689 Other abnormalities of gait and mobility: Secondary | ICD-10-CM | POA: Diagnosis not present

## 2022-02-25 DIAGNOSIS — Z23 Encounter for immunization: Secondary | ICD-10-CM | POA: Diagnosis not present

## 2022-04-08 DIAGNOSIS — N1330 Unspecified hydronephrosis: Secondary | ICD-10-CM | POA: Diagnosis not present

## 2022-04-08 DIAGNOSIS — N390 Urinary tract infection, site not specified: Secondary | ICD-10-CM | POA: Diagnosis not present

## 2022-04-10 DIAGNOSIS — Z960 Presence of urogenital implants: Secondary | ICD-10-CM | POA: Diagnosis not present

## 2022-04-10 DIAGNOSIS — N200 Calculus of kidney: Secondary | ICD-10-CM | POA: Diagnosis not present

## 2022-04-10 DIAGNOSIS — N1330 Unspecified hydronephrosis: Secondary | ICD-10-CM | POA: Diagnosis not present

## 2022-04-10 DIAGNOSIS — Z466 Encounter for fitting and adjustment of urinary device: Secondary | ICD-10-CM | POA: Diagnosis not present

## 2022-04-17 DIAGNOSIS — Z466 Encounter for fitting and adjustment of urinary device: Secondary | ICD-10-CM | POA: Diagnosis not present

## 2022-04-17 DIAGNOSIS — I10 Essential (primary) hypertension: Secondary | ICD-10-CM | POA: Diagnosis not present

## 2022-04-17 DIAGNOSIS — N131 Hydronephrosis with ureteral stricture, not elsewhere classified: Secondary | ICD-10-CM | POA: Diagnosis not present

## 2022-04-17 DIAGNOSIS — Z87442 Personal history of urinary calculi: Secondary | ICD-10-CM | POA: Diagnosis not present

## 2022-04-21 DIAGNOSIS — R251 Tremor, unspecified: Secondary | ICD-10-CM | POA: Diagnosis not present

## 2022-04-21 DIAGNOSIS — I10 Essential (primary) hypertension: Secondary | ICD-10-CM | POA: Diagnosis not present

## 2022-04-21 DIAGNOSIS — G4700 Insomnia, unspecified: Secondary | ICD-10-CM | POA: Diagnosis not present

## 2022-04-21 DIAGNOSIS — F332 Major depressive disorder, recurrent severe without psychotic features: Secondary | ICD-10-CM | POA: Diagnosis not present

## 2022-04-22 DIAGNOSIS — M5412 Radiculopathy, cervical region: Secondary | ICD-10-CM | POA: Diagnosis not present

## 2022-05-28 DIAGNOSIS — N390 Urinary tract infection, site not specified: Secondary | ICD-10-CM | POA: Diagnosis not present

## 2022-06-11 DIAGNOSIS — K219 Gastro-esophageal reflux disease without esophagitis: Secondary | ICD-10-CM | POA: Diagnosis not present

## 2022-06-11 DIAGNOSIS — R251 Tremor, unspecified: Secondary | ICD-10-CM | POA: Diagnosis not present

## 2022-06-11 DIAGNOSIS — F332 Major depressive disorder, recurrent severe without psychotic features: Secondary | ICD-10-CM | POA: Diagnosis not present

## 2022-06-11 DIAGNOSIS — I10 Essential (primary) hypertension: Secondary | ICD-10-CM | POA: Diagnosis not present

## 2022-06-17 DIAGNOSIS — N390 Urinary tract infection, site not specified: Secondary | ICD-10-CM | POA: Diagnosis not present

## 2022-07-01 DIAGNOSIS — N390 Urinary tract infection, site not specified: Secondary | ICD-10-CM | POA: Diagnosis not present

## 2022-07-01 DIAGNOSIS — N1330 Unspecified hydronephrosis: Secondary | ICD-10-CM | POA: Diagnosis not present

## 2022-07-17 DIAGNOSIS — I10 Essential (primary) hypertension: Secondary | ICD-10-CM | POA: Diagnosis not present

## 2022-07-17 DIAGNOSIS — N131 Hydronephrosis with ureteral stricture, not elsewhere classified: Secondary | ICD-10-CM | POA: Diagnosis not present

## 2022-07-17 DIAGNOSIS — Q615 Medullary cystic kidney: Secondary | ICD-10-CM | POA: Diagnosis not present

## 2022-07-17 DIAGNOSIS — Z79899 Other long term (current) drug therapy: Secondary | ICD-10-CM | POA: Diagnosis not present

## 2022-07-17 DIAGNOSIS — Z85038 Personal history of other malignant neoplasm of large intestine: Secondary | ICD-10-CM | POA: Diagnosis not present

## 2022-07-17 DIAGNOSIS — N2889 Other specified disorders of kidney and ureter: Secondary | ICD-10-CM | POA: Diagnosis not present

## 2022-09-02 DIAGNOSIS — J040 Acute laryngitis: Secondary | ICD-10-CM | POA: Diagnosis not present

## 2022-09-02 DIAGNOSIS — R051 Acute cough: Secondary | ICD-10-CM | POA: Diagnosis not present

## 2022-09-02 DIAGNOSIS — R030 Elevated blood-pressure reading, without diagnosis of hypertension: Secondary | ICD-10-CM | POA: Diagnosis not present

## 2022-09-29 DIAGNOSIS — J209 Acute bronchitis, unspecified: Secondary | ICD-10-CM | POA: Diagnosis not present

## 2022-09-29 DIAGNOSIS — R062 Wheezing: Secondary | ICD-10-CM | POA: Diagnosis not present

## 2022-09-29 DIAGNOSIS — R0981 Nasal congestion: Secondary | ICD-10-CM | POA: Diagnosis not present

## 2022-09-29 DIAGNOSIS — R0600 Dyspnea, unspecified: Secondary | ICD-10-CM | POA: Diagnosis not present

## 2022-10-02 DIAGNOSIS — R41840 Attention and concentration deficit: Secondary | ICD-10-CM | POA: Diagnosis not present

## 2022-10-02 DIAGNOSIS — Z6829 Body mass index (BMI) 29.0-29.9, adult: Secondary | ICD-10-CM | POA: Diagnosis not present

## 2022-10-02 DIAGNOSIS — D649 Anemia, unspecified: Secondary | ICD-10-CM | POA: Diagnosis not present

## 2022-10-02 DIAGNOSIS — Z79899 Other long term (current) drug therapy: Secondary | ICD-10-CM | POA: Diagnosis not present

## 2022-10-02 DIAGNOSIS — F3341 Major depressive disorder, recurrent, in partial remission: Secondary | ICD-10-CM | POA: Diagnosis not present

## 2022-10-02 DIAGNOSIS — K219 Gastro-esophageal reflux disease without esophagitis: Secondary | ICD-10-CM | POA: Diagnosis not present

## 2022-10-02 DIAGNOSIS — I10 Essential (primary) hypertension: Secondary | ICD-10-CM | POA: Diagnosis not present

## 2022-10-28 DIAGNOSIS — N390 Urinary tract infection, site not specified: Secondary | ICD-10-CM | POA: Diagnosis not present

## 2022-10-28 DIAGNOSIS — N1330 Unspecified hydronephrosis: Secondary | ICD-10-CM | POA: Diagnosis not present

## 2022-11-27 DIAGNOSIS — Z79899 Other long term (current) drug therapy: Secondary | ICD-10-CM | POA: Diagnosis not present

## 2022-11-27 DIAGNOSIS — N131 Hydronephrosis with ureteral stricture, not elsewhere classified: Secondary | ICD-10-CM | POA: Diagnosis not present

## 2022-11-27 DIAGNOSIS — I10 Essential (primary) hypertension: Secondary | ICD-10-CM | POA: Diagnosis not present

## 2023-01-07 DIAGNOSIS — Z0000 Encounter for general adult medical examination without abnormal findings: Secondary | ICD-10-CM | POA: Diagnosis not present

## 2023-01-07 DIAGNOSIS — R41840 Attention and concentration deficit: Secondary | ICD-10-CM | POA: Diagnosis not present

## 2023-01-07 DIAGNOSIS — K219 Gastro-esophageal reflux disease without esophagitis: Secondary | ICD-10-CM | POA: Diagnosis not present

## 2023-01-07 DIAGNOSIS — Z6830 Body mass index (BMI) 30.0-30.9, adult: Secondary | ICD-10-CM | POA: Diagnosis not present

## 2023-01-07 DIAGNOSIS — Z1211 Encounter for screening for malignant neoplasm of colon: Secondary | ICD-10-CM | POA: Diagnosis not present

## 2023-01-07 DIAGNOSIS — I10 Essential (primary) hypertension: Secondary | ICD-10-CM | POA: Diagnosis not present

## 2023-01-07 DIAGNOSIS — Z1331 Encounter for screening for depression: Secondary | ICD-10-CM | POA: Diagnosis not present

## 2023-01-07 DIAGNOSIS — F419 Anxiety disorder, unspecified: Secondary | ICD-10-CM | POA: Diagnosis not present

## 2023-01-07 DIAGNOSIS — Z7189 Other specified counseling: Secondary | ICD-10-CM | POA: Diagnosis not present

## 2023-01-07 DIAGNOSIS — Z1231 Encounter for screening mammogram for malignant neoplasm of breast: Secondary | ICD-10-CM | POA: Diagnosis not present

## 2023-01-09 DIAGNOSIS — Z1211 Encounter for screening for malignant neoplasm of colon: Secondary | ICD-10-CM | POA: Diagnosis not present

## 2023-02-19 DIAGNOSIS — R070 Pain in throat: Secondary | ICD-10-CM | POA: Diagnosis not present

## 2023-02-19 DIAGNOSIS — R300 Dysuria: Secondary | ICD-10-CM | POA: Diagnosis not present

## 2023-02-19 DIAGNOSIS — R051 Acute cough: Secondary | ICD-10-CM | POA: Diagnosis not present

## 2023-03-11 DIAGNOSIS — N1330 Unspecified hydronephrosis: Secondary | ICD-10-CM | POA: Diagnosis not present

## 2023-03-11 DIAGNOSIS — N390 Urinary tract infection, site not specified: Secondary | ICD-10-CM | POA: Diagnosis not present

## 2023-03-23 DIAGNOSIS — N132 Hydronephrosis with renal and ureteral calculous obstruction: Secondary | ICD-10-CM | POA: Diagnosis not present

## 2023-03-30 DIAGNOSIS — N390 Urinary tract infection, site not specified: Secondary | ICD-10-CM | POA: Diagnosis not present

## 2023-04-02 DIAGNOSIS — I10 Essential (primary) hypertension: Secondary | ICD-10-CM | POA: Diagnosis not present

## 2023-04-02 DIAGNOSIS — Z466 Encounter for fitting and adjustment of urinary device: Secondary | ICD-10-CM | POA: Diagnosis not present

## 2023-04-02 DIAGNOSIS — M170 Bilateral primary osteoarthritis of knee: Secondary | ICD-10-CM | POA: Diagnosis not present

## 2023-04-02 DIAGNOSIS — F419 Anxiety disorder, unspecified: Secondary | ICD-10-CM | POA: Diagnosis not present

## 2023-04-02 DIAGNOSIS — Z87442 Personal history of urinary calculi: Secondary | ICD-10-CM | POA: Diagnosis not present

## 2023-04-02 DIAGNOSIS — N131 Hydronephrosis with ureteral stricture, not elsewhere classified: Secondary | ICD-10-CM | POA: Diagnosis not present

## 2023-04-02 DIAGNOSIS — F32A Depression, unspecified: Secondary | ICD-10-CM | POA: Diagnosis not present

## 2023-04-02 DIAGNOSIS — Z79899 Other long term (current) drug therapy: Secondary | ICD-10-CM | POA: Diagnosis not present

## 2023-04-02 DIAGNOSIS — K219 Gastro-esophageal reflux disease without esophagitis: Secondary | ICD-10-CM | POA: Diagnosis not present

## 2023-04-06 DIAGNOSIS — F419 Anxiety disorder, unspecified: Secondary | ICD-10-CM | POA: Diagnosis not present

## 2023-04-06 DIAGNOSIS — G4700 Insomnia, unspecified: Secondary | ICD-10-CM | POA: Diagnosis not present

## 2023-04-06 DIAGNOSIS — K219 Gastro-esophageal reflux disease without esophagitis: Secondary | ICD-10-CM | POA: Diagnosis not present

## 2023-04-06 DIAGNOSIS — I10 Essential (primary) hypertension: Secondary | ICD-10-CM | POA: Diagnosis not present

## 2023-04-06 DIAGNOSIS — G4733 Obstructive sleep apnea (adult) (pediatric): Secondary | ICD-10-CM | POA: Diagnosis not present

## 2023-04-06 DIAGNOSIS — Z6829 Body mass index (BMI) 29.0-29.9, adult: Secondary | ICD-10-CM | POA: Diagnosis not present

## 2023-04-06 DIAGNOSIS — Z79899 Other long term (current) drug therapy: Secondary | ICD-10-CM | POA: Diagnosis not present

## 2023-04-06 DIAGNOSIS — R41840 Attention and concentration deficit: Secondary | ICD-10-CM | POA: Diagnosis not present

## 2023-04-06 DIAGNOSIS — F3341 Major depressive disorder, recurrent, in partial remission: Secondary | ICD-10-CM | POA: Diagnosis not present

## 2023-04-20 DIAGNOSIS — N390 Urinary tract infection, site not specified: Secondary | ICD-10-CM | POA: Diagnosis not present

## 2023-04-23 DIAGNOSIS — N390 Urinary tract infection, site not specified: Secondary | ICD-10-CM | POA: Diagnosis not present

## 2023-05-10 ENCOUNTER — Encounter (HOSPITAL_BASED_OUTPATIENT_CLINIC_OR_DEPARTMENT_OTHER): Payer: Self-pay | Admitting: Emergency Medicine

## 2023-05-10 ENCOUNTER — Ambulatory Visit (HOSPITAL_BASED_OUTPATIENT_CLINIC_OR_DEPARTMENT_OTHER)
Admission: EM | Admit: 2023-05-10 | Discharge: 2023-05-10 | Disposition: A | Discharge status: Home / Self Care | Payer: Medicare Other | Attending: Internal Medicine | Admitting: Internal Medicine

## 2023-05-10 DIAGNOSIS — N189 Chronic kidney disease, unspecified: Secondary | ICD-10-CM | POA: Diagnosis not present

## 2023-05-10 DIAGNOSIS — N200 Calculus of kidney: Secondary | ICD-10-CM | POA: Diagnosis not present

## 2023-05-10 DIAGNOSIS — Z87440 Personal history of urinary (tract) infections: Secondary | ICD-10-CM | POA: Insufficient documentation

## 2023-05-10 DIAGNOSIS — N390 Urinary tract infection, site not specified: Secondary | ICD-10-CM

## 2023-05-10 LAB — POCT URINALYSIS DIP (MANUAL ENTRY)
Bilirubin, UA: NEGATIVE
Glucose, UA: NEGATIVE mg/dL
Ketones, POC UA: NEGATIVE mg/dL
Nitrite, UA: NEGATIVE
Protein Ur, POC: >=300 mg/dL — AB
Spec Grav, UA: >=1.03 — AB
Urobilinogen, UA: 2 U/dL — AB
pH, UA: 6.5

## 2023-05-10 MED ORDER — AMOXICILLIN-POT CLAVULANATE 875-125 MG PO TABS: 1.0000 | ORAL_TABLET | Freq: Two times a day (BID) | ORAL | 0 refills | Status: AC

## 2023-05-10 NOTE — ED Triage Notes (Signed)
Pt c/o pain with urination on right side x 2 days ago.

## 2023-05-10 NOTE — ED Provider Notes (Signed)
[facility]    CSN: [id number] Arrival date & time: 05/10/23  1235      History   Chief Complaint No chief complaint on file.   HPI INGRI KATA is a 67 y.o. female.   HPI Back to urinary tract infection symptom onset 2 days ago symptoms include dysuria, frequency, urgency and dark urine.  States has chronic kidney stones currently has 2 ureteral stents, has kidney disease, sees a Barrister's clerk at [employer].  Last CT scan 1 month ago per patient report.  States gets frequent UTIs usually responds well to Augmentin or Levaquin No past medical history on file.  There are no active problems to display for this patient.     OB History   No obstetric history on file.      Home Medications    Prior to Admission medications   Medication Sig Start Date End Date Taking? Authorizing Provider  amphetamine-dextroamphetamine (ADDERALL) 15 MG tablet Take 1 tablet by mouth daily as needed. 04/27/23  Yes [provider]  buPROPion (WELLBUTRIN XL) 150 MG 24 hr tablet Take 150 mg by mouth daily. 05/07/23  Yes [provider]  sertraline (ZOLOFT) 100 MG tablet Take by mouth. 06/03/19  Yes [provider]  ALPRAZolam (XANAX) 0.5 MG tablet Take 0.5 mg by mouth 2 (two) times daily as needed.    [provider]  olmesartan (BENICAR) 20 MG tablet Take 20 mg by mouth daily.    [provider]  tamsulosin (FLOMAX) 0.4 MG CAPS capsule Take 0.4 mg by mouth daily.    [provider]  traZODone (DESYREL) 100 MG tablet Take 100 mg by mouth at bedtime.    [provider]    Family History No family history on file.  Social History Social History   Tobacco Use   Smoking status: Never   Smokeless tobacco: Never  Substance Use Topics   Alcohol use: Never   Drug use: Never     Allergies   Patient has no known allergies.   Review of Systems Review of Systems  Constitutional:  Negative for appetite change, chills  and fever.  Respiratory:  Negative for cough.   Genitourinary:  Positive for dysuria, frequency and hematuria. Negative for urgency and vaginal discharge.     Physical Exam Triage Vital Signs ED Triage Vitals  Encounter Vitals Group     BP 05/10/23 1410 (!) 189/95     Systolic BP Percentile --      Diastolic BP Percentile --      Pulse Rate 05/10/23 1410 63     Resp 05/10/23 1410 18     Temp 05/10/23 1410 98.1 F (36.7 C)     Temp Source 05/10/23 1410 Oral     SpO2 05/10/23 1410 95 %     Weight --      Height --      Head Circumference --      Peak Flow --      Pain Score 05/10/23 1408 4     Pain Loc --      Pain Education --      Exclude from Growth Chart --    No data found.  Updated Vital Signs BP (!) 189/95 (BP Location: Right Arm)   Pulse 63   Temp 98.1 F (36.7 C) (Oral)   Resp 18   SpO2 95%   Visual Acuity Right Eye Distance:   Left Eye Distance:   Bilateral Distance:    Right Eye  Near:   Left Eye Near:    Bilateral Near:     Physical Exam Vitals and nursing note reviewed.  HENT:     Head: Normocephalic and atraumatic.  Cardiovascular:     Rate and Rhythm: Normal rate and regular rhythm.     Heart sounds: Normal heart sounds.  Pulmonary:     Effort: Pulmonary effort is normal.  Abdominal:     General: Bowel sounds are normal.     Palpations: Abdomen is soft.     Tenderness: There is no abdominal tenderness. There is no right CVA tenderness, left CVA tenderness or guarding.  Neurological:     Mental Status: She is alert.      UC Treatments / Results  Labs (all labs ordered are listed, but only abnormal results are displayed) Labs Reviewed  POCT URINALYSIS DIP (MANUAL ENTRY)    EKG   Radiology No results found.  Procedures Procedures (including critical care time)  Medications Ordered in UC Medications - No data to display  Initial Impression / Assessment and Plan / UC Course  I have reviewed the triage vital signs and the  nursing notes.  Pertinent labs & imaging results that were available during my care of the patient were reviewed by me and considered in my medical decision making (see chart for details).     67 year old female with known kidney disease, chronic kidney stones and ureteral stents presents with suspected UTI symptoms for 2 days.  Admits dysuria, frequency, urgency denies fever, chills, sweats.  She is well-appearing although hypertensive at 189/95, Afebrile, abdominal exam benign, no CVA tenderness.  Point-of-care urinalysis urine is cloudy, elevated specific gravity at 1.030, large blood and large leuks.  Discussed with patient will start antibiotics for urinary tract infection however given strict follow-up instructions should see her specialist in 1 to 2 days, go to ED for worsening pain new symptoms or concerns specifically for fever, flank pain or vomiting Final Clinical Impressions(s) / UC Diagnoses   Final diagnoses:  None   Discharge Instructions   None    ED Prescriptions   None    PDMP not reviewed this encounter.   Meliton Rattan, [place] 05/10/23 1433

## 2023-05-10 NOTE — Discharge Instructions (Addendum)
Follow-up with your specialist in 1 to 2 days Go to the emergency department for worsening symptoms or new symptoms such as fever, vomiting Increase fluid intake

## 2023-05-12 LAB — URINE CULTURE: Culture: >=100000 — AB

## 2023-05-29 DIAGNOSIS — N390 Urinary tract infection, site not specified: Secondary | ICD-10-CM | POA: Diagnosis not present

## 2023-05-29 DIAGNOSIS — N200 Calculus of kidney: Secondary | ICD-10-CM | POA: Diagnosis not present

## 2023-07-07 DIAGNOSIS — N1330 Unspecified hydronephrosis: Secondary | ICD-10-CM | POA: Diagnosis not present

## 2023-07-07 DIAGNOSIS — N390 Urinary tract infection, site not specified: Secondary | ICD-10-CM | POA: Diagnosis not present

## 2023-07-11 HISTORY — PX: INCISIONAL HERNIA REPAIR: SHX193

## 2023-07-13 DIAGNOSIS — G4700 Insomnia, unspecified: Secondary | ICD-10-CM | POA: Diagnosis not present

## 2023-07-13 DIAGNOSIS — M65331 Trigger finger, right middle finger: Secondary | ICD-10-CM | POA: Diagnosis not present

## 2023-07-13 DIAGNOSIS — Z6829 Body mass index (BMI) 29.0-29.9, adult: Secondary | ICD-10-CM | POA: Diagnosis not present

## 2023-07-13 DIAGNOSIS — K4090 Unilateral inguinal hernia, without obstruction or gangrene, not specified as recurrent: Secondary | ICD-10-CM | POA: Diagnosis not present

## 2023-07-13 DIAGNOSIS — K219 Gastro-esophageal reflux disease without esophagitis: Secondary | ICD-10-CM | POA: Diagnosis not present

## 2023-07-13 DIAGNOSIS — L989 Disorder of the skin and subcutaneous tissue, unspecified: Secondary | ICD-10-CM | POA: Diagnosis not present

## 2023-07-13 DIAGNOSIS — R41840 Attention and concentration deficit: Secondary | ICD-10-CM | POA: Diagnosis not present

## 2023-07-13 DIAGNOSIS — I10 Essential (primary) hypertension: Secondary | ICD-10-CM | POA: Diagnosis not present

## 2023-07-13 DIAGNOSIS — F3341 Major depressive disorder, recurrent, in partial remission: Secondary | ICD-10-CM | POA: Diagnosis not present

## 2023-07-22 DIAGNOSIS — K4090 Unilateral inguinal hernia, without obstruction or gangrene, not specified as recurrent: Secondary | ICD-10-CM | POA: Diagnosis not present

## 2023-07-22 DIAGNOSIS — K432 Incisional hernia without obstruction or gangrene: Secondary | ICD-10-CM | POA: Diagnosis not present

## 2023-07-27 DIAGNOSIS — N390 Urinary tract infection, site not specified: Secondary | ICD-10-CM | POA: Diagnosis not present

## 2023-07-30 DIAGNOSIS — Z87442 Personal history of urinary calculi: Secondary | ICD-10-CM | POA: Diagnosis not present

## 2023-07-30 DIAGNOSIS — N131 Hydronephrosis with ureteral stricture, not elsewhere classified: Secondary | ICD-10-CM | POA: Diagnosis not present

## 2023-07-30 DIAGNOSIS — F32A Depression, unspecified: Secondary | ICD-10-CM | POA: Diagnosis not present

## 2023-07-30 DIAGNOSIS — Q615 Medullary cystic kidney: Secondary | ICD-10-CM | POA: Diagnosis not present

## 2023-07-30 DIAGNOSIS — F419 Anxiety disorder, unspecified: Secondary | ICD-10-CM | POA: Diagnosis not present

## 2023-07-30 DIAGNOSIS — E669 Obesity, unspecified: Secondary | ICD-10-CM | POA: Diagnosis not present

## 2023-07-30 DIAGNOSIS — Z85038 Personal history of other malignant neoplasm of large intestine: Secondary | ICD-10-CM | POA: Diagnosis not present

## 2023-07-30 DIAGNOSIS — F988 Other specified behavioral and emotional disorders with onset usually occurring in childhood and adolescence: Secondary | ICD-10-CM | POA: Diagnosis not present

## 2023-07-30 DIAGNOSIS — I10 Essential (primary) hypertension: Secondary | ICD-10-CM | POA: Diagnosis not present

## 2023-07-30 DIAGNOSIS — Z466 Encounter for fitting and adjustment of urinary device: Secondary | ICD-10-CM | POA: Diagnosis not present

## 2023-07-30 DIAGNOSIS — Z981 Arthrodesis status: Secondary | ICD-10-CM | POA: Diagnosis not present

## 2023-07-30 DIAGNOSIS — Z79899 Other long term (current) drug therapy: Secondary | ICD-10-CM | POA: Diagnosis not present

## 2023-07-30 DIAGNOSIS — Z6829 Body mass index (BMI) 29.0-29.9, adult: Secondary | ICD-10-CM | POA: Diagnosis not present

## 2023-07-30 DIAGNOSIS — G479 Sleep disorder, unspecified: Secondary | ICD-10-CM | POA: Diagnosis not present

## 2023-07-30 DIAGNOSIS — N136 Pyonephrosis: Secondary | ICD-10-CM | POA: Diagnosis not present

## 2023-08-10 DIAGNOSIS — F419 Anxiety disorder, unspecified: Secondary | ICD-10-CM | POA: Diagnosis not present

## 2023-08-10 DIAGNOSIS — G4733 Obstructive sleep apnea (adult) (pediatric): Secondary | ICD-10-CM | POA: Diagnosis not present

## 2023-08-10 DIAGNOSIS — Z79899 Other long term (current) drug therapy: Secondary | ICD-10-CM | POA: Diagnosis not present

## 2023-08-10 DIAGNOSIS — N289 Disorder of kidney and ureter, unspecified: Secondary | ICD-10-CM | POA: Diagnosis not present

## 2023-08-10 DIAGNOSIS — K4090 Unilateral inguinal hernia, without obstruction or gangrene, not specified as recurrent: Secondary | ICD-10-CM | POA: Diagnosis not present

## 2023-08-10 DIAGNOSIS — N131 Hydronephrosis with ureteral stricture, not elsewhere classified: Secondary | ICD-10-CM | POA: Diagnosis not present

## 2023-08-10 DIAGNOSIS — I10 Essential (primary) hypertension: Secondary | ICD-10-CM | POA: Diagnosis not present

## 2023-08-10 DIAGNOSIS — K219 Gastro-esophageal reflux disease without esophagitis: Secondary | ICD-10-CM | POA: Diagnosis not present

## 2023-08-10 DIAGNOSIS — K432 Incisional hernia without obstruction or gangrene: Secondary | ICD-10-CM | POA: Diagnosis not present

## 2023-08-10 DIAGNOSIS — Q615 Medullary cystic kidney: Secondary | ICD-10-CM | POA: Diagnosis not present

## 2023-08-10 DIAGNOSIS — M170 Bilateral primary osteoarthritis of knee: Secondary | ICD-10-CM | POA: Diagnosis not present

## 2023-08-10 DIAGNOSIS — F319 Bipolar disorder, unspecified: Secondary | ICD-10-CM | POA: Diagnosis not present

## 2023-08-31 DIAGNOSIS — N390 Urinary tract infection, site not specified: Secondary | ICD-10-CM | POA: Diagnosis not present

## 2023-09-16 DIAGNOSIS — Z8701 Personal history of pneumonia (recurrent): Secondary | ICD-10-CM | POA: Diagnosis not present

## 2023-09-16 DIAGNOSIS — R42 Dizziness and giddiness: Secondary | ICD-10-CM | POA: Diagnosis not present

## 2023-09-16 DIAGNOSIS — J449 Chronic obstructive pulmonary disease, unspecified: Secondary | ICD-10-CM | POA: Diagnosis not present

## 2023-09-16 DIAGNOSIS — R9431 Abnormal electrocardiogram [ECG] [EKG]: Secondary | ICD-10-CM | POA: Diagnosis not present

## 2023-09-16 DIAGNOSIS — N189 Chronic kidney disease, unspecified: Secondary | ICD-10-CM | POA: Diagnosis not present

## 2023-09-16 DIAGNOSIS — I129 Hypertensive chronic kidney disease with stage 1 through stage 4 chronic kidney disease, or unspecified chronic kidney disease: Secondary | ICD-10-CM | POA: Diagnosis not present

## 2023-09-16 DIAGNOSIS — I10 Essential (primary) hypertension: Secondary | ICD-10-CM | POA: Diagnosis not present

## 2023-09-16 DIAGNOSIS — I951 Orthostatic hypotension: Secondary | ICD-10-CM | POA: Diagnosis not present

## 2023-09-16 DIAGNOSIS — I440 Atrioventricular block, first degree: Secondary | ICD-10-CM | POA: Diagnosis not present

## 2023-09-16 DIAGNOSIS — N390 Urinary tract infection, site not specified: Secondary | ICD-10-CM | POA: Diagnosis not present

## 2023-09-16 DIAGNOSIS — Z87440 Personal history of urinary (tract) infections: Secondary | ICD-10-CM | POA: Diagnosis not present

## 2023-10-07 DIAGNOSIS — N390 Urinary tract infection, site not specified: Secondary | ICD-10-CM | POA: Diagnosis not present

## 2023-11-18 DIAGNOSIS — N390 Urinary tract infection, site not specified: Secondary | ICD-10-CM | POA: Diagnosis not present

## 2023-11-18 DIAGNOSIS — N1330 Unspecified hydronephrosis: Secondary | ICD-10-CM | POA: Diagnosis not present

## 2023-11-26 DIAGNOSIS — Z6829 Body mass index (BMI) 29.0-29.9, adult: Secondary | ICD-10-CM | POA: Diagnosis not present

## 2023-11-26 DIAGNOSIS — M170 Bilateral primary osteoarthritis of knee: Secondary | ICD-10-CM | POA: Diagnosis not present

## 2023-11-26 DIAGNOSIS — I129 Hypertensive chronic kidney disease with stage 1 through stage 4 chronic kidney disease, or unspecified chronic kidney disease: Secondary | ICD-10-CM | POA: Diagnosis not present

## 2023-11-26 DIAGNOSIS — N181 Chronic kidney disease, stage 1: Secondary | ICD-10-CM | POA: Diagnosis not present

## 2023-11-26 DIAGNOSIS — E669 Obesity, unspecified: Secondary | ICD-10-CM | POA: Diagnosis not present

## 2023-11-26 DIAGNOSIS — G4700 Insomnia, unspecified: Secondary | ICD-10-CM | POA: Diagnosis not present

## 2023-11-26 DIAGNOSIS — N131 Hydronephrosis with ureteral stricture, not elsewhere classified: Secondary | ICD-10-CM | POA: Diagnosis not present

## 2023-11-26 DIAGNOSIS — F988 Other specified behavioral and emotional disorders with onset usually occurring in childhood and adolescence: Secondary | ICD-10-CM | POA: Diagnosis not present

## 2023-11-26 DIAGNOSIS — F32A Depression, unspecified: Secondary | ICD-10-CM | POA: Diagnosis not present

## 2023-11-26 DIAGNOSIS — Z466 Encounter for fitting and adjustment of urinary device: Secondary | ICD-10-CM | POA: Diagnosis not present

## 2023-11-26 DIAGNOSIS — K219 Gastro-esophageal reflux disease without esophagitis: Secondary | ICD-10-CM | POA: Diagnosis not present

## 2023-11-26 DIAGNOSIS — Q615 Medullary cystic kidney: Secondary | ICD-10-CM | POA: Diagnosis not present

## 2023-11-26 DIAGNOSIS — R7303 Prediabetes: Secondary | ICD-10-CM | POA: Diagnosis not present

## 2023-11-26 DIAGNOSIS — G4733 Obstructive sleep apnea (adult) (pediatric): Secondary | ICD-10-CM | POA: Diagnosis not present

## 2023-11-26 DIAGNOSIS — Z85038 Personal history of other malignant neoplasm of large intestine: Secondary | ICD-10-CM | POA: Diagnosis not present

## 2023-11-26 DIAGNOSIS — N189 Chronic kidney disease, unspecified: Secondary | ICD-10-CM | POA: Diagnosis not present

## 2023-11-26 DIAGNOSIS — Z79899 Other long term (current) drug therapy: Secondary | ICD-10-CM | POA: Diagnosis not present

## 2023-11-26 DIAGNOSIS — F419 Anxiety disorder, unspecified: Secondary | ICD-10-CM | POA: Diagnosis not present

## 2023-11-26 DIAGNOSIS — Z87442 Personal history of urinary calculi: Secondary | ICD-10-CM | POA: Diagnosis not present

## 2024-01-21 DIAGNOSIS — R0981 Nasal congestion: Secondary | ICD-10-CM | POA: Diagnosis not present

## 2024-01-21 DIAGNOSIS — K591 Functional diarrhea: Secondary | ICD-10-CM | POA: Diagnosis not present

## 2024-01-21 DIAGNOSIS — M791 Myalgia, unspecified site: Secondary | ICD-10-CM | POA: Diagnosis not present

## 2024-02-23 DIAGNOSIS — N390 Urinary tract infection, site not specified: Secondary | ICD-10-CM | POA: Diagnosis not present

## 2024-03-02 DIAGNOSIS — N289 Disorder of kidney and ureter, unspecified: Secondary | ICD-10-CM | POA: Diagnosis not present

## 2024-03-02 DIAGNOSIS — N1330 Unspecified hydronephrosis: Secondary | ICD-10-CM | POA: Diagnosis not present

## 2024-03-02 DIAGNOSIS — N390 Urinary tract infection, site not specified: Secondary | ICD-10-CM | POA: Diagnosis not present

## 2024-03-09 DIAGNOSIS — N390 Urinary tract infection, site not specified: Secondary | ICD-10-CM | POA: Diagnosis not present

## 2024-03-17 DIAGNOSIS — G4700 Insomnia, unspecified: Secondary | ICD-10-CM | POA: Diagnosis not present

## 2024-03-17 DIAGNOSIS — F419 Anxiety disorder, unspecified: Secondary | ICD-10-CM | POA: Diagnosis not present

## 2024-03-17 DIAGNOSIS — I10 Essential (primary) hypertension: Secondary | ICD-10-CM | POA: Diagnosis not present

## 2024-03-17 DIAGNOSIS — F988 Other specified behavioral and emotional disorders with onset usually occurring in childhood and adolescence: Secondary | ICD-10-CM | POA: Diagnosis not present

## 2024-03-17 DIAGNOSIS — N131 Hydronephrosis with ureteral stricture, not elsewhere classified: Secondary | ICD-10-CM | POA: Diagnosis not present

## 2024-03-17 DIAGNOSIS — Z6829 Body mass index (BMI) 29.0-29.9, adult: Secondary | ICD-10-CM | POA: Diagnosis not present

## 2024-03-17 DIAGNOSIS — F32A Depression, unspecified: Secondary | ICD-10-CM | POA: Diagnosis not present

## 2024-03-17 DIAGNOSIS — Q615 Medullary cystic kidney: Secondary | ICD-10-CM | POA: Diagnosis not present

## 2024-03-17 DIAGNOSIS — Z79899 Other long term (current) drug therapy: Secondary | ICD-10-CM | POA: Diagnosis not present

## 2024-03-17 DIAGNOSIS — G8929 Other chronic pain: Secondary | ICD-10-CM | POA: Diagnosis not present

## 2024-03-17 DIAGNOSIS — Z466 Encounter for fitting and adjustment of urinary device: Secondary | ICD-10-CM | POA: Diagnosis not present

## 2024-03-17 DIAGNOSIS — K219 Gastro-esophageal reflux disease without esophagitis: Secondary | ICD-10-CM | POA: Diagnosis not present

## 2024-03-17 DIAGNOSIS — G4733 Obstructive sleep apnea (adult) (pediatric): Secondary | ICD-10-CM | POA: Diagnosis not present

## 2024-03-17 DIAGNOSIS — E669 Obesity, unspecified: Secondary | ICD-10-CM | POA: Diagnosis not present

## 2024-03-17 DIAGNOSIS — Z85038 Personal history of other malignant neoplasm of large intestine: Secondary | ICD-10-CM | POA: Diagnosis not present

## 2024-03-17 DIAGNOSIS — R7303 Prediabetes: Secondary | ICD-10-CM | POA: Diagnosis not present

## 2024-03-17 DIAGNOSIS — M170 Bilateral primary osteoarthritis of knee: Secondary | ICD-10-CM | POA: Diagnosis not present

## 2024-05-27 DIAGNOSIS — I350 Nonrheumatic aortic (valve) stenosis: Secondary | ICD-10-CM | POA: Insufficient documentation

## 2024-05-27 DIAGNOSIS — R011 Cardiac murmur, unspecified: Secondary | ICD-10-CM | POA: Insufficient documentation

## 2024-05-27 DIAGNOSIS — I10 Essential (primary) hypertension: Secondary | ICD-10-CM | POA: Insufficient documentation

## 2024-05-31 ENCOUNTER — Ambulatory Visit

## 2024-05-31 VITALS — BP 158/88 | HR 72 | Ht 63.5 in | Wt 172.0 lb

## 2024-05-31 DIAGNOSIS — I350 Nonrheumatic aortic (valve) stenosis: Secondary | ICD-10-CM

## 2024-05-31 DIAGNOSIS — I10 Essential (primary) hypertension: Secondary | ICD-10-CM

## 2024-05-31 NOTE — Progress Notes (Signed)
 "  Cardiology Consultation:    Date:  05/31/2024   ID:  Mackenzie Pearson, DOB 1955-07-21, MRN [id number]  PCP:  Jefferey Fitch, MD  Cardiologist:  Alean SAUNDERS Nelton Amsden, MD   Referring MD: Jefferey Fitch, MD   No chief complaint on file.    ASSESSMENT AND PLAN:   [name] 69 year old woman with history of hypertension, moderate aortic stenosis [echocardiogram 05/20/2024 at [facility] V-max 3.3 m/s], uses Adderall as needed for focus.   Problem List Items Addressed This Visit       Cardiovascular and Mediastinum   Essential hypertension - Primary   Suboptimal. Target blood pressure below 130/80 mmHg. Continue olmesartan 40 mg once daily. Recent medication dose change. Recommended she follow-up blood pressure readings at home closely. Keep a log for 1 to 2 weeks. If consistently above target would recommend starting carvedilol 3.125 mg twice daily and titrate up as needed.       Relevant Orders   EKG 12-Lead (Completed)   Moderate aortic stenosis   Reviewed findings of aortic stenosis noted on echocardiogram. Progression of the valve disease discussed and potential symptoms with severe aortic stenosis discussed.  Will recommend follow-up echocardiogram tentatively in 1 year around January 2027.      Return to clinic for follow-up in 6 months.  History of Present Illness:    Mackenzie Pearson is a 69 y.o. female who is being seen today for the evaluation of aortic stenosis at the request of Jefferey Fitch, MD.   Has history of hypertension, moderate aortic stenosis noted [V-max 3.3 m/s on echocardiogram 05/20/2024 at [facility], uses Adderall as needed for focus on workdays. No prior history of CAD, CHF, MI, CVA.  Pleasant woman here for the visit today accompanied by her husband.  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  Good functional capacity. No cardiac symptoms such as chest pain,  shortness of breath, orthopnea or paroxysmal nocturnal dyspnea. Has longstanding history of hypertension. Does not routinely check blood pressures at home. Advised her to keep track of blood pressure readings if possible using an upper arm cuff. Recently olmesartan dose was increased by PCP about a week ago to 40 mg once daily.  Does not smoke. Occasional alcohol consumption on social events. No substance abuse.  Father died in his 65s from unknown health causes.  EKG in the clinic today shows sinus rhythm heart rate 72/min, PR interval 188 ms, QRS duration 94 ms, QTc 466 ms no ischemic changes.  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  Blood work from 05/23/2024 BUN 20, creatinine 1.15 GFR 53. Sodium 140, potassium 4.4. Hemoglobin 14.2, hematocrit 41, WBC 4.5 and platelets 220. TSH normal 1.04 Hemoglobin A1c normal 5.6. Lipid panel with total cholesterol 199, triglycerides 165, LDL 110 and HDL 61.   Past Medical History:  Diagnosis Date   Cardiac murmur, unspecified    Essential hypertension    Nonrheumatic aortic valve stenosis     Past Surgical History:  Procedure Laterality Date   APPENDECTOMY  2001   BILATERAL URETHRAL STENTS     CERVICAL SPINE SURGERY  02/2022   HYSTERECTOMY, TOTAL, LAPAROSCOPIC, ROBOT-ASSISTED WITH SALPINGECTOMY     INCISIONAL HERNIA REPAIR  07/2023   KIDNEY STONE EXTRACTION     LAPAROSCOPIC SIGMOID COLECTOMY     left breast lump removal      Current Medications: Active Medications[1]   Allergies:   Duloxetine   Social History  Socioeconomic History   Marital status: Married    Spouse name: Not on file   Number of children: Not on file   Years of education: Not on file   Highest education level: Not on file  Occupational History   Not on file  Tobacco Use   Smoking status: Never   Smokeless tobacco: Never  Substance and Sexual Activity   Alcohol use: Never   Drug use: Never   Sexual activity: Not on file  Other  Topics Concern   Not on file  Social History Narrative   Not on file   Social Drivers of Health   Tobacco Use: Low Risk (05/31/2024)   Patient History    Smoking Tobacco Use: Never    Smokeless Tobacco Use: Never    Passive Exposure: Not on file  Financial Resource Strain: Not on file  Food Insecurity: Not on file  Transportation Needs: Not on file  Physical Activity: Not on file  Stress: Not on file  Social Connections: Not on file  Depression (EYV7-0): Not on file  Alcohol Screen: Not on file  Housing: Not on file  Utilities: Not on file  Health Literacy: Not on file     Family History: The patient's family history includes Aneurysm in her mother; Asthma in her brother, mother, and sister; Cancer in her paternal uncle; Depression in her father; Heart Problems in her mother and paternal aunt; Hypertension in her brother and sister; Kyphosis in her mother; Suicidality in her father. ROS:   Please see the history of present illness.    All 14 point review of systems negative except as described per history of present illness.  EKGs/Labs/Other Studies Reviewed:    The following studies were reviewed today:   EKG:  EKG Interpretation Date/Time:  Tuesday May 31 2024 09:53:26 EST Ventricular Rate:  72 PR Interval:  188 QRS Duration:  94 QT Interval:  426 QTC Calculation: 466 R Axis:   -48  Text Interpretation: Normal sinus rhythm Left anterior fascicular block Cannot rule out Inferior infarct (masked by fascicular block?) , age undetermined Cannot rule out Anterior infarct , age undetermined No previous ECGs available Confirmed by Liborio Hai reddy [phone]) on 05/31/2024 10:30:12 AM    Recent Labs: No results found for requested labs within last 365 days.  Recent Lipid Panel No results found for: CHOL, TRIG, HDL, CHOLHDL, VLDL, LDLCALC, LDLDIRECT  Physical Exam:    VS:  BP (!) 158/88   Pulse 72   Ht 5' 3.5 (1.613 m)   Wt 172 lb (78 kg)    SpO2 98%   BMI 29.99 kg/m     Wt Readings from Last 3 Encounters:  05/31/24 172 lb (78 kg)     GENERAL:  Well nourished, well developed in no acute distress NECK: No JVD; No carotid bruits CARDIAC: RRR, S1 and S2 present, 4/6 ejection systolic murmur best heard right upper sternal border and radiates to both carotids. CHEST:  Clear to auscultation without rales, wheezing or rhonchi  Extremities: No pitting pedal edema. Pulses bilaterally symmetric with radial 2+ and dorsalis pedis 2+ NEUROLOGIC:  Alert and oriented x 3  Medication Adjustments/Labs and Tests Ordered: Current medicines are reviewed at length with the patient today.  Concerns regarding medicines are outlined above.  Orders Placed This Encounter  Procedures   EKG 12-Lead   No orders of the defined types were placed in this encounter.   Signed, Hai jess Liborio, MD, MPH, [facility]. 05/31/2024 10:51 AM  [facility]     [1]  Current Meds  Medication Sig   ALPRAZolam (XANAX) 0.5 MG tablet Take 0.5 mg by mouth 2 (two) times daily as needed.   amphetamine-dextroamphetamine (ADDERALL) 15 MG tablet Take 1 tablet by mouth daily as needed.   buPROPion (WELLBUTRIN XL) 150 MG 24 hr tablet Take 150 mg by mouth daily.   diclofenac (VOLTAREN) 75 MG EC tablet Take 75 mg by mouth in the morning and at bedtime.   lansoprazole (PREVACID) 15 MG capsule Take 15 mg by mouth as needed.   olmesartan (BENICAR) 20 MG tablet Take 20 mg by mouth daily.   sertraline (ZOLOFT) 100 MG tablet Take by mouth.   traZODone (DESYREL) 100 MG tablet Take 100 mg by mouth at bedtime.   "

## 2024-05-31 NOTE — Patient Instructions (Signed)
 "         Please keep a BP log for 2 weeks and send by MyChart or mail.                      Dr. Liborio [address] [place], [place] [address]  Blood Pressure Record Sheet To take your blood pressure, you will need a blood pressure machine. You can buy a blood pressure machine (blood pressure monitor) at your clinic, drug store, or online. When choosing one, consider: An automatic monitor that has an arm cuff. A cuff that wraps snugly around your upper arm. You should be able to fit only one finger between your arm and the cuff. A device that stores blood pressure reading results. Do not choose a monitor that measures your blood pressure from your wrist or finger. Follow your health care provider's instructions for how to take your blood pressure. To use this form: Get one reading in the morning (a.m.) 1-2 hours after you take any medicines. Get one reading in the evening (p.m.) before supper.   Blood pressure log Date: _______________________  a.m. _____________________(1st reading) HR___________            p.m. _____________________(2nd reading) HR__________  Date: _______________________  a.m. _____________________(1st reading) HR___________            p.m. _____________________(2nd reading) HR__________  Date: _______________________  a.m. _____________________(1st reading) HR___________            p.m. _____________________(2nd reading) HR__________  Date: _______________________  a.m. _____________________(1st reading) HR___________            p.m. _____________________(2nd reading) HR__________  Date: _______________________  a.m. _____________________(1st reading) HR___________            p.m. _____________________(2nd reading) HR__________  Date: _______________________  a.m. _____________________(1st reading) HR___________            p.m. _____________________(2nd reading) HR__________  Date: _______________________  a.m. _____________________(1st  reading) HR___________            p.m. _____________________(2nd reading) HR__________   This information is not intended to replace advice given to you by your health care provider. Make sure you discuss any questions you have with your health care provider. Document Revised: 08/17/2019 Document Reviewed: 08/17/2019 Elsevier Patient Education  2021 Elsevier Inc.  Medication Instructions:  Your physician recommends that you continue on your current medications as directed. Please refer to the Current Medication list given to you today.  *If you need a refill on your cardiac medications before your next appointment, please call your pharmacy*   Lab Work: None ordered If you have labs (blood work) drawn today and your tests are completely normal, you will receive your results only by: MyChart Message (if you have MyChart) OR A paper copy in the mail If you have any lab test that is abnormal or we need to change your treatment, we will call you to review the results.   Testing/Procedures: None ordered   Follow-Up: At [facility], you and your health needs are our priority.  As part of our continuing mission to provide you with exceptional heart care, we have created designated Provider Care Teams.  These Care Teams include your primary Cardiologist (physician) and Advanced Practice Providers (APPs -  Physician Assistants and Nurse Practitioners) who all work together to provide you with the care you need, when you need it.  We recommend signing up for the patient portal called MyChart.  Sign up information is provided  on this After Visit Summary.  MyChart is used to connect with patients for Virtual Visits (Telemedicine).  Patients are able to view lab/test results, encounter notes, upcoming appointments, etc.  Non-urgent messages can be sent to your provider as well.   To learn more about what you can do with MyChart, go to [email or web address].    Your next appointment:    6 month(s)  The format for your next appointment:   In Person  Provider:   Alean Kobus, MD    Other Instructions none  Important Information About Sugar      "

## 2024-05-31 NOTE — Assessment & Plan Note (Signed)
 Suboptimal. Target blood pressure below 130/80 mmHg. Continue olmesartan 40 mg once daily. Recent medication dose change. Recommended she follow-up blood pressure readings at home closely. Keep a log for 1 to 2 weeks. If consistently above target would recommend starting carvedilol 3.125 mg twice daily and titrate up as needed.

## 2024-05-31 NOTE — Assessment & Plan Note (Signed)
 Reviewed findings of aortic stenosis noted on echocardiogram. Progression of the valve disease discussed and potential symptoms with severe aortic stenosis discussed.  Will recommend follow-up echocardiogram tentatively in 1 year around January 2027.

## 2024-06-09 MED ORDER — CARVEDILOL 3.125 MG PO TABS: 3.1250 mg | ORAL_TABLET | Freq: Two times a day (BID) | ORAL | 3 refills | Status: AC
